# Patient Record
Sex: Female | Born: 1990 | Race: White | Hispanic: No | Marital: Married | Smoking: Never smoker
Health system: Southern US, Community
[De-identification: ages and names within clinical notes are randomized; demographics above are authoritative.]

## PROBLEM LIST (undated history)

## (undated) ENCOUNTER — Inpatient Hospital Stay (HOSPITAL_COMMUNITY): Payer: Self-pay

## (undated) DIAGNOSIS — N6019 Diffuse cystic mastopathy of unspecified breast: Secondary | ICD-10-CM

## (undated) DIAGNOSIS — R5383 Other fatigue: Secondary | ICD-10-CM

## (undated) DIAGNOSIS — G43909 Migraine, unspecified, not intractable, without status migrainosus: Secondary | ICD-10-CM

## (undated) DIAGNOSIS — IMO0002 Reserved for concepts with insufficient information to code with codable children: Secondary | ICD-10-CM

## (undated) DIAGNOSIS — N12 Tubulo-interstitial nephritis, not specified as acute or chronic: Secondary | ICD-10-CM

## (undated) DIAGNOSIS — O9989 Other specified diseases and conditions complicating pregnancy, childbirth and the puerperium: Secondary | ICD-10-CM

## (undated) DIAGNOSIS — O139 Gestational [pregnancy-induced] hypertension without significant proteinuria, unspecified trimester: Secondary | ICD-10-CM

## (undated) DIAGNOSIS — R000 Tachycardia, unspecified: Secondary | ICD-10-CM

## (undated) DIAGNOSIS — R5381 Other malaise: Secondary | ICD-10-CM

## (undated) DIAGNOSIS — B2790 Infectious mononucleosis, unspecified without complication: Secondary | ICD-10-CM

## (undated) DIAGNOSIS — K589 Irritable bowel syndrome without diarrhea: Secondary | ICD-10-CM

## (undated) DIAGNOSIS — Z283 Underimmunization status: Secondary | ICD-10-CM

## (undated) DIAGNOSIS — I499 Cardiac arrhythmia, unspecified: Secondary | ICD-10-CM

## (undated) DIAGNOSIS — Z8619 Personal history of other infectious and parasitic diseases: Secondary | ICD-10-CM

## (undated) HISTORY — DX: Other malaise: R53.81

## (undated) HISTORY — DX: Irritable bowel syndrome, unspecified: K58.9

## (undated) HISTORY — PX: NO PAST SURGERIES: SHX2092

## (undated) HISTORY — DX: Diffuse cystic mastopathy of unspecified breast: N60.19

## (undated) HISTORY — DX: Other fatigue: R53.83

## (undated) HISTORY — DX: Cardiac arrhythmia, unspecified: I49.9

## (undated) HISTORY — DX: Personal history of other infectious and parasitic diseases: Z86.19

## (undated) HISTORY — DX: Gestational (pregnancy-induced) hypertension without significant proteinuria, unspecified trimester: O13.9

## (undated) HISTORY — DX: Migraine, unspecified, not intractable, without status migrainosus: G43.909

## (undated) HISTORY — DX: Tachycardia, unspecified: R00.0

---
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
## 2009-12-16 ENCOUNTER — Ambulatory Visit (HOSPITAL_COMMUNITY): Admission: RE | Admit: 2009-12-16 | Discharge: 2009-12-16 | Payer: Self-pay | Admitting: Orthopaedic Surgery

## 2010-10-02 ENCOUNTER — Ambulatory Visit (HOSPITAL_COMMUNITY): Admission: RE | Admit: 2010-10-02 | Discharge: 2010-10-02 | Payer: Self-pay | Admitting: Cardiovascular Disease

## 2010-10-02 ENCOUNTER — Encounter (INDEPENDENT_AMBULATORY_CARE_PROVIDER_SITE_OTHER): Payer: Self-pay | Admitting: Cardiovascular Disease

## 2010-12-14 ENCOUNTER — Encounter: Payer: Self-pay | Admitting: Family Medicine

## 2010-12-14 ENCOUNTER — Encounter: Payer: Self-pay | Admitting: Orthopaedic Surgery

## 2011-08-25 ENCOUNTER — Other Ambulatory Visit (HOSPITAL_BASED_OUTPATIENT_CLINIC_OR_DEPARTMENT_OTHER): Payer: Self-pay | Admitting: Rheumatology

## 2011-08-25 ENCOUNTER — Ambulatory Visit (HOSPITAL_BASED_OUTPATIENT_CLINIC_OR_DEPARTMENT_OTHER)
Admission: RE | Admit: 2011-08-25 | Discharge: 2011-08-25 | Disposition: A | Discharge status: Home / Self Care | Payer: 59 | Source: Ambulatory Visit | Attending: Family Medicine | Admitting: Family Medicine

## 2011-08-25 DIAGNOSIS — R51 Headache: Secondary | ICD-10-CM | POA: Insufficient documentation

## 2011-08-25 DIAGNOSIS — F29 Unspecified psychosis not due to a substance or known physiological condition: Secondary | ICD-10-CM | POA: Insufficient documentation

## 2011-11-24 NOTE — L&D Delivery Note (Signed)
Delivery Note At 9:13 AM a viable and healthy female was delivered via Vaginal, Spontaneous Delivery (Presentation: ;  ).  APGAR: 9, 9; weight 7 lb 12.2 oz (3521 g).   Placenta status: Intact, Manual removal.  Cord: 3 vessels with the following complications: None.  Cord pH: na Bolckow x one reduced on perineum.  Anesthesia: Epidural  Episiotomy: None Lacerations: 1st degree Suture Repair: 2.0 vicryl rapide Est. Blood Loss (mL): 600  Mom to postpartum.  Baby to nursery-stable.  Shailene Demonbreun J 09/27/2012, 10:26 AM

## 2012-03-03 LAB — OB RESULTS CONSOLE HEPATITIS B SURFACE ANTIGEN: Hepatitis B Surface Ag: NEGATIVE

## 2012-03-03 LAB — OB RESULTS CONSOLE ANTIBODY SCREEN: Antibody Screen: NEGATIVE

## 2012-03-03 LAB — OB RESULTS CONSOLE ABO/RH: RH Type: POSITIVE

## 2012-03-03 LAB — OB RESULTS CONSOLE RUBELLA ANTIBODY, IGM: Rubella: UNDETERMINED

## 2012-09-23 ENCOUNTER — Encounter (HOSPITAL_COMMUNITY): Payer: Self-pay | Admitting: *Deleted

## 2012-09-23 ENCOUNTER — Telehealth (HOSPITAL_COMMUNITY): Payer: Self-pay | Admitting: *Deleted

## 2012-09-23 NOTE — Telephone Encounter (Signed)
Preadmission screen  

## 2012-09-26 ENCOUNTER — Other Ambulatory Visit: Payer: Self-pay | Admitting: Obstetrics and Gynecology

## 2012-09-26 ENCOUNTER — Inpatient Hospital Stay (HOSPITAL_COMMUNITY)
Admission: AD | Admit: 2012-09-26 | Discharge: 2012-09-29 | DRG: 774 | Disposition: A | Discharge status: Home / Self Care | Payer: 59 | Source: Ambulatory Visit | Attending: Obstetrics and Gynecology | Admitting: Obstetrics and Gynecology

## 2012-09-26 ENCOUNTER — Encounter (HOSPITAL_COMMUNITY): Payer: Self-pay | Admitting: *Deleted

## 2012-09-26 DIAGNOSIS — D649 Anemia, unspecified: Secondary | ICD-10-CM | POA: Diagnosis present

## 2012-09-26 DIAGNOSIS — Z283 Underimmunization status: Secondary | ICD-10-CM

## 2012-09-26 DIAGNOSIS — IMO0002 Reserved for concepts with insufficient information to code with codable children: Secondary | ICD-10-CM | POA: Diagnosis present

## 2012-09-26 DIAGNOSIS — O9902 Anemia complicating childbirth: Secondary | ICD-10-CM | POA: Diagnosis present

## 2012-09-26 DIAGNOSIS — O99892 Other specified diseases and conditions complicating childbirth: Secondary | ICD-10-CM

## 2012-09-26 DIAGNOSIS — I2510 Atherosclerotic heart disease of native coronary artery without angina pectoris: Secondary | ICD-10-CM | POA: Diagnosis present

## 2012-09-26 DIAGNOSIS — I498 Other specified cardiac arrhythmias: Secondary | ICD-10-CM | POA: Diagnosis present

## 2012-09-26 HISTORY — DX: Underimmunization status: Z28.3

## 2012-09-26 HISTORY — DX: Other specified diseases and conditions complicating pregnancy, childbirth and the puerperium: O99.89

## 2012-09-26 HISTORY — DX: Reserved for concepts with insufficient information to code with codable children: IMO0002

## 2012-09-26 LAB — CBC
HCT: 33.5 % — ABNORMAL LOW (ref 36.0–46.0)
MCV: 94.1 fL (ref 78.0–100.0)
Platelets: 218 10*3/uL (ref 150–400)
RBC: 3.56 MIL/uL — ABNORMAL LOW (ref 3.87–5.11)
RDW: 13.5 % (ref 11.5–15.5)
WBC: 10.7 10*3/uL — ABNORMAL HIGH (ref 4.0–10.5)

## 2012-09-26 MED ORDER — IBUPROFEN 600 MG PO TABS
600.0000 mg | ORAL_TABLET | Freq: Four times a day (QID) | ORAL | Status: DC | PRN
  Administered 2012-09-27: 600 mg via ORAL
  Filled 2012-09-26: qty 1

## 2012-09-26 MED ORDER — OXYCODONE-ACETAMINOPHEN 5-325 MG PO TABS: 1.0000 | ORAL_TABLET | ORAL | Status: DC | PRN

## 2012-09-26 MED ORDER — PHENYLEPHRINE 40 MCG/ML (10ML) SYRINGE FOR IV PUSH (FOR BLOOD PRESSURE SUPPORT): 80.0000 ug | PREFILLED_SYRINGE | INTRAVENOUS | Status: DC | PRN

## 2012-09-26 MED ORDER — EPHEDRINE 5 MG/ML INJ
10.0000 mg | INTRAVENOUS | Status: DC | PRN
  Filled 2012-09-26: qty 4

## 2012-09-26 MED ORDER — ZOLPIDEM TARTRATE 5 MG PO TABS
5.0000 mg | ORAL_TABLET | Freq: Every evening | ORAL | Status: DC | PRN
  Administered 2012-09-26: 5 mg via ORAL
  Filled 2012-09-26: qty 1

## 2012-09-26 MED ORDER — SODIUM CHLORIDE 0.9 % IJ SOLN: 3.0000 mL | Freq: Two times a day (BID) | INTRAMUSCULAR | Status: DC

## 2012-09-26 MED ORDER — PHENYLEPHRINE 40 MCG/ML (10ML) SYRINGE FOR IV PUSH (FOR BLOOD PRESSURE SUPPORT)
80.0000 ug | PREFILLED_SYRINGE | INTRAVENOUS | Status: DC | PRN
  Filled 2012-09-26: qty 5

## 2012-09-26 MED ORDER — OXYTOCIN 40 UNITS IN LACTATED RINGERS INFUSION - SIMPLE MED
62.5000 mL/h | INTRAVENOUS | Status: DC
  Administered 2012-09-27: 500 mL/h via INTRAVENOUS

## 2012-09-26 MED ORDER — ONDANSETRON HCL 4 MG/2ML IJ SOLN: 4.0000 mg | Freq: Four times a day (QID) | INTRAMUSCULAR | Status: DC | PRN

## 2012-09-26 MED ORDER — LACTATED RINGERS IV SOLN
500.0000 mL | Freq: Once | INTRAVENOUS | Status: AC
  Administered 2012-09-27: 500 mL via INTRAVENOUS

## 2012-09-26 MED ORDER — OXYTOCIN BOLUS FROM INFUSION: 500.0000 mL | INTRAVENOUS | Status: DC

## 2012-09-26 MED ORDER — LIDOCAINE HCL (PF) 1 % IJ SOLN
30.0000 mL | INTRAMUSCULAR | Status: DC | PRN
  Filled 2012-09-26: qty 30

## 2012-09-26 MED ORDER — BUTORPHANOL TARTRATE 1 MG/ML IJ SOLN
1.0000 mg | INTRAMUSCULAR | Status: DC | PRN
  Administered 2012-09-27 (×2): 1 mg via INTRAVENOUS
  Filled 2012-09-26 (×3): qty 1

## 2012-09-26 MED ORDER — DIPHENHYDRAMINE HCL 50 MG/ML IJ SOLN: 12.5000 mg | INTRAMUSCULAR | Status: DC | PRN

## 2012-09-26 MED ORDER — EPHEDRINE 5 MG/ML INJ: 10.0000 mg | INTRAVENOUS | Status: DC | PRN

## 2012-09-26 MED ORDER — FENTANYL 2.5 MCG/ML BUPIVACAINE 1/10 % EPIDURAL INFUSION (WH - ANES)
14.0000 mL/h | INTRAMUSCULAR | Status: DC
  Administered 2012-09-27: 14 mL/h via EPIDURAL
  Filled 2012-09-26 (×2): qty 125

## 2012-09-26 MED ORDER — ACETAMINOPHEN 325 MG PO TABS: 650.0000 mg | ORAL_TABLET | ORAL | Status: DC | PRN

## 2012-09-26 MED ORDER — LACTATED RINGERS IV SOLN
INTRAVENOUS | Status: DC
  Administered 2012-09-26 – 2012-09-27 (×2): via INTRAVENOUS

## 2012-09-26 MED ORDER — TERBUTALINE SULFATE 1 MG/ML IJ SOLN: 0.2500 mg | Freq: Once | INTRAMUSCULAR | Status: AC | PRN

## 2012-09-26 MED ORDER — CITRIC ACID-SODIUM CITRATE 334-500 MG/5ML PO SOLN: 30.0000 mL | ORAL | Status: DC | PRN

## 2012-09-26 MED ORDER — LACTATED RINGERS IV SOLN: 500.0000 mL | INTRAVENOUS | Status: DC | PRN

## 2012-09-26 MED ORDER — SODIUM CHLORIDE 0.9 % IJ SOLN: 3.0000 mL | INTRAMUSCULAR | Status: DC | PRN

## 2012-09-26 MED ORDER — MISOPROSTOL 25 MCG QUARTER TABLET
25.0000 ug | ORAL_TABLET | ORAL | Status: DC | PRN
  Administered 2012-09-26 (×2): 25 ug via VAGINAL
  Filled 2012-09-26 (×2): qty 0.25

## 2012-09-26 MED ORDER — SODIUM CHLORIDE 0.9 % IV SOLN: 250.0000 mL | INTRAVENOUS | Status: DC | PRN

## 2012-09-26 NOTE — Progress Notes (Signed)
Autumn Stephens is a 21 y.o. G1P0 at [redacted]w[redacted]d by LMP admitted for induction of labor due to symptomatic tachycardia on BBlocker.  Subjective: comfortable  Objective: BP 135/88  Pulse 105  Temp 98.8 F (37.1 C) (Oral)  Resp 20  Ht 5\' 10"  (1.778 m)  Wt 100.245 kg (221 lb)  BMI 31.71 kg/m2  LMP 12/12/2011      FHT:  FHR: 155 bpm, variability: moderate,  accelerations:  Present,  decelerations:  Absent UC:   none SVE:    cl/70/0 Cytotec pending  Labs: No results found for this basename: WBC, HGB, HCT, MCV, PLT    Assessment / Plan: 39 + weeks Maternal Arrythmia  Labor: Progressing normally Preeclampsia:  an Fetal Wellbeing:  Category I Pain Control:  Labor support without medications I/D:  n/a Anticipated MOD:  NSVD  Autumn Stephens 09/26/2012, 5:47 PM

## 2012-09-26 NOTE — Progress Notes (Signed)
Autumn Stephens is a 21 y.o. G1P0000 at [redacted]w[redacted]d by LMP admitted for induction of labor due to maternal arrhythmia.  Subjective: comfortable  Objective: BP 126/73  Pulse 85  Temp 98.9 F (37.2 C) (Oral)  Resp 18  Ht 5\' 10"  (1.778 m)  Wt 100.245 kg (221 lb)  BMI 31.71 kg/m2  SpO2 97%  LMP 12/12/2011      FHT:  FHR: 145 bpm, variability: moderate,  accelerations:  Present,  decelerations:  Absent UC:   irregular, every 10 minutes SVE:   Dilation: Closed Effacement (%): 60 Station: -2 Exam by:: Montez Morita, RNC  Labs: Lab Results  Component Value Date   WBC 10.7* 09/26/2012   HGB 11.3* 09/26/2012   HCT 33.5* 09/26/2012   MCV 94.1 09/26/2012   PLT 218 09/26/2012    Assessment / Plan: Maternal Arrhythmia.  Labor: Progressing normally Preeclampsia:  na Fetal Wellbeing:  Category I Pain Control:  Labor support without medications I/D:  n/a Anticipated MOD:  NSVD  Ivyana Locey J 09/26/2012, 11:16 PM

## 2012-09-26 NOTE — H&P (Signed)
Autumn Stephens, Autumn Stephens              ACCOUNT NO.:  [id number]  MEDICAL RECORD NO.:  [id number]  LOCATION:  9175                          FACILITY:  [facility]  PHYSICIAN:  Lenoard Aden, M.D.DATE OF BIRTH:  1991-04-15  DATE OF ADMISSION:  09/26/2012 DATE OF DISCHARGE:                             HISTORY & PHYSICAL   CHIEF COMPLAINT:  Maternal arrhythmia at 39 weeks for induction.  HISTORY OF PRESENT ILLNESS:  A 21 year old white female, G1, P0, at [redacted] weeks gestation with increasing symptomatology due to maternal arrhythmia, currently on atenolol for induction of labor.  ALLERGIES:  She has allergies to BIAXIN, AUGMENTIN and AVELOX.  MEDICAL HISTORY:  Remarkable for symptomatic tachycardia with a normal cardiac workup, migraine headaches.  SOCIAL HISTORY:  Noncontributory.  FAMILY HISTORY:  Remarkable for prostate cancer, rheumatoid arthritis, skin cancer, and diabetes.  This is her first pregnancy.  Pregnancy complicated mostly by maternal tachycardia with increasing symptomatology despite with atenolol therapy.  PHYSICAL EXAMINATION:  GENERAL:  She is a well-developed, well- nourished, white female in no acute distress. VITAL SIGNS:  Blood pressure is 135/88, pulse of 105. HEENT:  Normal. NECK:  Supple.  Full range of motion. LUNGS:  Clear. HEART:  Tachycardia, but otherwise normal rhythm. ABDOMEN:  Soft, gravid, nontender. BACK:  No CVA tenderness. EXTREMITIES:  There are no cords. NEUROLOGIC:  Nonfocal. SKIN:  Intact. PELVIC:  Cervix is closed, 70% effaced, vertex, 0 station, slightly posterior.  NST is reactive.  Cytotec pending.  IMPRESSION:  A 39-week intrauterine pregnancy with maternal arrhythmia with increased symptomatology.  PLAN:  Proceed with induction.  Risks and benefits discussed. Anticipate attempts at vaginal delivery.     Lenoard Aden, M.D.     RJT/MEDQ  D:  09/26/2012  T:  09/26/2012  Job:  [id number]

## 2012-09-27 ENCOUNTER — Inpatient Hospital Stay (HOSPITAL_COMMUNITY): Admission: RE | Admit: 2012-09-27 | Payer: 59 | Source: Ambulatory Visit

## 2012-09-27 ENCOUNTER — Encounter (HOSPITAL_COMMUNITY): Payer: Self-pay | Admitting: Anesthesiology

## 2012-09-27 ENCOUNTER — Inpatient Hospital Stay (HOSPITAL_COMMUNITY): Payer: 59 | Admitting: Anesthesiology

## 2012-09-27 ENCOUNTER — Encounter (HOSPITAL_COMMUNITY): Payer: Self-pay | Admitting: *Deleted

## 2012-09-27 MED ORDER — SENNOSIDES-DOCUSATE SODIUM 8.6-50 MG PO TABS
2.0000 | ORAL_TABLET | Freq: Every day | ORAL | Status: DC
  Administered 2012-09-27 – 2012-09-28 (×2): 2 via ORAL

## 2012-09-27 MED ORDER — WITCH HAZEL-GLYCERIN EX PADS: 1.0000 "application " | MEDICATED_PAD | CUTANEOUS | Status: DC | PRN

## 2012-09-27 MED ORDER — LIDOCAINE HCL (PF) 1 % IJ SOLN
INTRAMUSCULAR | Status: DC | PRN
  Administered 2012-09-27: 4 mL
  Administered 2012-09-27: 5 mL

## 2012-09-27 MED ORDER — BENZOCAINE-MENTHOL 20-0.5 % EX AERO
1.0000 "application " | INHALATION_SPRAY | CUTANEOUS | Status: DC | PRN
  Administered 2012-09-27 (×2): 1 via TOPICAL
  Filled 2012-09-27 (×2): qty 56

## 2012-09-27 MED ORDER — IBUPROFEN 600 MG PO TABS
600.0000 mg | ORAL_TABLET | Freq: Four times a day (QID) | ORAL | Status: DC
  Administered 2012-09-27 – 2012-09-29 (×8): 600 mg via ORAL
  Filled 2012-09-27 (×7): qty 1

## 2012-09-27 MED ORDER — TETANUS-DIPHTH-ACELL PERTUSSIS 5-2.5-18.5 LF-MCG/0.5 IM SUSP: 0.5000 mL | Freq: Once | INTRAMUSCULAR | Status: DC

## 2012-09-27 MED ORDER — OXYTOCIN 40 UNITS IN LACTATED RINGERS INFUSION - SIMPLE MED
1.0000 m[IU]/min | INTRAVENOUS | Status: DC
  Filled 2012-09-27: qty 1000

## 2012-09-27 MED ORDER — PRENATAL MULTIVITAMIN CH
1.0000 | ORAL_TABLET | Freq: Every day | ORAL | Status: DC
  Administered 2012-09-28 – 2012-09-29 (×2): 1 via ORAL
  Filled 2012-09-27 (×2): qty 1

## 2012-09-27 MED ORDER — METHYLERGONOVINE MALEATE 0.2 MG/ML IJ SOLN: 0.2000 mg | INTRAMUSCULAR | Status: DC | PRN

## 2012-09-27 MED ORDER — DIPHENHYDRAMINE HCL 25 MG PO CAPS: 25.0000 mg | ORAL_CAPSULE | Freq: Four times a day (QID) | ORAL | Status: DC | PRN

## 2012-09-27 MED ORDER — ZOLPIDEM TARTRATE 5 MG PO TABS: 5.0000 mg | ORAL_TABLET | Freq: Every evening | ORAL | Status: DC | PRN

## 2012-09-27 MED ORDER — ONDANSETRON HCL 4 MG PO TABS: 4.0000 mg | ORAL_TABLET | ORAL | Status: DC | PRN

## 2012-09-27 MED ORDER — LANOLIN HYDROUS EX OINT: TOPICAL_OINTMENT | CUTANEOUS | Status: DC | PRN

## 2012-09-27 MED ORDER — TERBUTALINE SULFATE 1 MG/ML IJ SOLN: 0.2500 mg | Freq: Once | INTRAMUSCULAR | Status: DC | PRN

## 2012-09-27 MED ORDER — SIMETHICONE 80 MG PO CHEW: 80.0000 mg | CHEWABLE_TABLET | ORAL | Status: DC | PRN

## 2012-09-27 MED ORDER — METHYLERGONOVINE MALEATE 0.2 MG/ML IJ SOLN
INTRAMUSCULAR | Status: AC
  Administered 2012-09-27: 0.2 mg
  Filled 2012-09-27: qty 1

## 2012-09-27 MED ORDER — FENTANYL 2.5 MCG/ML BUPIVACAINE 1/10 % EPIDURAL INFUSION (WH - ANES)
INTRAMUSCULAR | Status: DC | PRN
  Administered 2012-09-27: 15 mL/h via EPIDURAL

## 2012-09-27 MED ORDER — DIBUCAINE 1 % RE OINT: 1.0000 "application " | TOPICAL_OINTMENT | RECTAL | Status: DC | PRN

## 2012-09-27 MED ORDER — OXYCODONE-ACETAMINOPHEN 5-325 MG PO TABS
1.0000 | ORAL_TABLET | ORAL | Status: DC | PRN
  Administered 2012-09-27: 0.5 via ORAL
  Filled 2012-09-27: qty 1

## 2012-09-27 MED ORDER — ONDANSETRON HCL 4 MG/2ML IJ SOLN: 4.0000 mg | INTRAMUSCULAR | Status: DC | PRN

## 2012-09-27 MED ORDER — METHYLERGONOVINE MALEATE 0.2 MG PO TABS: 0.2000 mg | ORAL_TABLET | ORAL | Status: DC | PRN

## 2012-09-27 NOTE — Anesthesia Procedure Notes (Signed)
Epidural Patient location during procedure: OB Start time: 09/27/2012 3:25 AM  Staffing Anesthesiologist: Nikeisha Klutz A. Performed by: anesthesiologist   Preanesthetic Checklist Completed: patient identified, site marked, surgical consent, pre-op evaluation, timeout performed, IV checked, risks and benefits discussed and monitors and equipment checked  Epidural Patient position: sitting Prep: site prepped and draped and DuraPrep Patient monitoring: continuous pulse ox and blood pressure Approach: midline Injection technique: LOR air  Needle:  Needle type: Tuohy  Needle gauge: 17 G Needle length: 9 cm and 9 Needle insertion depth: 7 cm Catheter type: closed end flexible Catheter size: 19 Gauge Catheter at skin depth: 12 cm Test dose: negative and Other  Assessment Events: blood not aspirated, injection not painful, no injection resistance, negative IV test and no paresthesia  Additional Notes Patient identified. Risks and benefits discussed including failed block, incomplete  Pain control, post dural puncture headache, nerve damage, paralysis, blood pressure Changes, nausea, vomiting, reactions to medications-both toxic and allergic and post Partum back pain. All questions were answered. Patient expressed understanding and wished to proceed. Sterile technique was used throughout procedure. Epidural site was Dressed with sterile barrier dressing. No paresthesias, signs of intravascular injection Or signs of intrathecal spread were encountered. Attempt x 2. Difficult due to poor position. 9ml xylocaine 1.5% local infiltration. Patient was more comfortable after the epidural was dosed. Please see RN's note for documentation of vital signs and FHR which are stable.

## 2012-09-27 NOTE — Anesthesia Preprocedure Evaluation (Addendum)
Anesthesia Evaluation  Patient identified by MRN, date of birth, ID band Patient awake    Reviewed: Allergy & Precautions, H&P , Patient's Chart, lab work & pertinent test results, reviewed documented beta blocker date and time   Airway Mallampati: III TM Distance: >3 FB Neck ROM: full    Dental No notable dental hx. (+) Teeth Intact   Pulmonary neg pulmonary ROS,  breath sounds clear to auscultation  Pulmonary exam normal       Cardiovascular Rhythm:regular Rate:Normal  Tachycardia-takes atenolol   Neuro/Psych  Headaches,    GI/Hepatic negative GI ROS, Neg liver ROS,   Endo/Other  negative endocrine ROS  Renal/GU negative Renal ROS  negative genitourinary   Musculoskeletal   Abdominal Normal abdominal exam  (+)   Peds  Hematology negative hematology ROS (+)   Anesthesia Other Findings   Reproductive/Obstetrics (+) Pregnancy                          Anesthesia Physical Anesthesia Plan  ASA: II  Anesthesia Plan: Epidural   Post-op Pain Management:    Induction:   Airway Management Planned:   Additional Equipment:   Intra-op Plan:   Post-operative Plan:   Informed Consent: I have reviewed the patients History and Physical, chart, labs and discussed the procedure including the risks, benefits and alternatives for the proposed anesthesia with the patient or authorized representative who has indicated his/her understanding and acceptance.   Dental Advisory Given  Plan Discussed with: Anesthesiologist  Anesthesia Plan Comments:         Anesthesia Quick Evaluation

## 2012-09-27 NOTE — Anesthesia Postprocedure Evaluation (Signed)
  Anesthesia Post-op Note  Patient: Autumn Stephens  Procedure(s) Performed: * No procedures listed *  Patient Location: Mother/Baby  Anesthesia Type:Epidural  Level of Consciousness: awake, alert  and oriented  Airway and Oxygen Therapy: Patient Spontanous Breathing  Post-op Pain: none  Post-op Assessment: Post-op Vital signs reviewed and Patient's Cardiovascular Status Stable  Post-op Vital Signs: Reviewed and stable  Complications: No apparent anesthesia complications

## 2012-09-27 NOTE — Progress Notes (Signed)
Autumn Stephens is a 21 y.o. G1P0000 at [redacted]w[redacted]d by LMP admitted for induction of labor due to maternal arrhythmia.  Subjective: Occ contractions   Objective: BP 140/96  Pulse 109  Temp 98.4 F (36.9 C) (Oral)  Resp 18  Ht 5\' 10"  (1.778 m)  Wt 100.245 kg (221 lb)  BMI 31.71 kg/m2  SpO2 99%  LMP 12/12/2011   Total I/O In: -  Out: 350 [Urine:350]  FHT:  FHR: 155 bpm, variability: moderate,  accelerations:  Present,  decelerations:  Absent UC:   regular, every 3 minutes SVE:   Dilation: 9 Effacement (%): 90 Station: +1 Exam by:: l.poore, rn   Labs: Lab Results  Component Value Date   WBC 10.7* 09/26/2012   HGB 11.3* 09/26/2012   HCT 33.5* 09/26/2012   MCV 94.1 09/26/2012   PLT 218 09/26/2012    Assessment / Plan: Induction of labor due to maternal tachycardia,  progressing well on pitocin  Labor: Progressing normally Preeclampsia:  na Fetal Wellbeing:  Category I Pain Control:  Epidural I/D:  n/a Anticipated MOD:  NSVD  Carroll Ranney J 09/27/2012, 7:00 AM

## 2012-09-27 NOTE — Progress Notes (Signed)
Pt awakened in a puddle of amniotic fluid.  RN not in the room.

## 2012-09-28 ENCOUNTER — Encounter (HOSPITAL_COMMUNITY): Payer: Self-pay

## 2012-09-28 DIAGNOSIS — IMO0002 Reserved for concepts with insufficient information to code with codable children: Secondary | ICD-10-CM

## 2012-09-28 DIAGNOSIS — O99892 Other specified diseases and conditions complicating childbirth: Secondary | ICD-10-CM

## 2012-09-28 DIAGNOSIS — Z2839 Other underimmunization status: Secondary | ICD-10-CM

## 2012-09-28 DIAGNOSIS — Z283 Underimmunization status: Secondary | ICD-10-CM

## 2012-09-28 HISTORY — DX: Reserved for concepts with insufficient information to code with codable children: IMO0002

## 2012-09-28 HISTORY — DX: Other underimmunization status: Z28.39

## 2012-09-28 HISTORY — DX: Other specified diseases and conditions complicating childbirth: O99.892

## 2012-09-28 LAB — CBC
HCT: 26.2 % — ABNORMAL LOW (ref 36.0–46.0)
Hemoglobin: 8.7 g/dL — ABNORMAL LOW (ref 12.0–15.0)
MCH: 31.4 pg (ref 26.0–34.0)
MCHC: 33.2 g/dL (ref 30.0–36.0)
MCV: 94.6 fL (ref 78.0–100.0)

## 2012-09-28 NOTE — Progress Notes (Signed)
PPD 1 SVD  S:  Reports feeling well.             Tolerating po/ No nausea or vomiting             Bleeding is light             Pain controlled with Motrin             Up ad lib / ambulatory / voiding well   Newborn  Information for the patient's newborn:  Jaleeah, Slight [[id number]]  female  breast feeding    O:  A & O x 3 NAD             VS:  Filed Vitals:   09/27/12 1217 09/27/12 1621 09/28/12 0000 09/28/12 0545  BP: 128/80 130/81 126/78 114/61  Pulse: 103 106 116 101  Temp: 97.4 F (36.3 C) 97.4 F (36.3 C) 97.5 F (36.4 C) 98.5 F (36.9 C)  TempSrc: Oral Oral Oral Oral  Resp: 20 18 20 16   Height:      Weight:      SpO2:        LABS:  Basename 09/28/12 0535 09/26/12 1655  WBC 10.9* 10.7*  HGB 8.7* 11.3*  HCT 26.2* 33.5*  PLT 147* 218    Blood type: A/Positive/-- (04/11 0000)  Rubella: Equivocal (04/11 0000)   I&O: I/O last 3 completed shifts: In: -  Out: 1550 [Urine:950; Blood:600]        Abdomen: soft, non-tender, non-distended              Fundus: firm, non-tender, U -1  Perineum: repair intact  Lochia: small  Extremities: trace edema, no calf pain or tenderness, neg Homans    A/P: PPD # 1 21 y.o., G1P1001    Active Problems:  NSVD (normal spontaneous vaginal delivery - 11/5)  Postpartum care following vaginal delivery  Perineal laceration, first degree  Maternal anemia complicating pregnancy, childbirth, or the puerperium  Rubella nonimmune status, delivered, current hospitalization  MMR prior to DC  TDaP up to date, declines flu vaccine  Doing well - stable status  Routine post partum orders  Anticipate discharge home in AM.   Quierra Silverio, CNM 09/28/2012, 11:52 AM

## 2012-09-29 MED ORDER — POLYSACCHARIDE IRON COMPLEX 150 MG PO CAPS
150.0000 mg | ORAL_CAPSULE | Freq: Every day | ORAL | Status: DC
  Administered 2012-09-29: 150 mg via ORAL
  Filled 2012-09-29: qty 1

## 2012-09-29 MED ORDER — MEASLES, MUMPS & RUBELLA VAC ~~LOC~~ INJ
0.5000 mL | INJECTION | Freq: Once | SUBCUTANEOUS | Status: AC
  Administered 2012-09-29: 0.5 mL via SUBCUTANEOUS
  Filled 2012-09-29: qty 0.5

## 2012-09-29 MED ORDER — MISOPROSTOL 25 MCG QUARTER TABLET: 25.0000 ug | ORAL_TABLET | ORAL | Status: DC | PRN

## 2012-09-29 MED ORDER — TERBUTALINE SULFATE 1 MG/ML IJ SOLN: 0.2500 mg | Freq: Once | INTRAMUSCULAR | Status: DC | PRN

## 2012-09-29 MED ORDER — POLYSACCHARIDE IRON COMPLEX 150 MG PO CAPS: 150.0000 mg | ORAL_CAPSULE | Freq: Every day | ORAL | Status: DC

## 2012-09-29 MED ORDER — OXYCODONE-ACETAMINOPHEN 5-325 MG PO TABS: 1.0000 | ORAL_TABLET | ORAL | Status: DC | PRN

## 2012-09-29 MED ORDER — OXYTOCIN 40 UNITS IN LACTATED RINGERS INFUSION - SIMPLE MED: 1.0000 m[IU]/min | INTRAVENOUS | Status: DC

## 2012-09-29 MED ORDER — ZOLPIDEM TARTRATE 5 MG PO TABS: 5.0000 mg | ORAL_TABLET | Freq: Every evening | ORAL | Status: DC | PRN

## 2012-09-29 MED ORDER — IBUPROFEN 600 MG PO TABS: 600.0000 mg | ORAL_TABLET | Freq: Four times a day (QID) | ORAL | Status: DC

## 2012-09-29 NOTE — Discharge Summary (Signed)
Obstetric Discharge Summary  Reason for Admission: onset of labor Prenatal Procedures: none Intrapartum Procedures: spontaneous vaginal delivery Postpartum Procedures: none Complications-Operative and Postpartum: 2nd degree perineal laceration Hemoglobin  Date Value Range Status  09/28/2012 8.7* 12.0 - 15.0 g/dL Final     DELTA CHECK NOTED     REPEATED TO VERIFY     HCT  Date Value Range Status  09/28/2012 26.2* 36.0 - 46.0 % Final    Physical Exam:  General: alert, cooperative and no distress Lochia: appropriate Uterine Fundus: firm Incision: healing well DVT Evaluation: No evidence of DVT seen on physical exam.  Discharge Diagnoses: Term Pregnancy-delivered  / iron deficiency anemia - ABL  Discharge Information: Date: 09/29/2012 Activity: pelvic rest Diet: routine Medications: PNV, Ibuprofen, Percocet and atenolol and Niferex 150 (iron) Condition: stable Instructions: refer to practice specific booklet Discharge to: home Follow-up Information    Follow up with Lenoard Aden, MD. Schedule an appointment as soon as possible for a visit in 6 weeks.   Contact information:   [address] [place] [address] [phone]          Newborn Data: Live born female  Birth Weight: 7 lb 12.2 oz (3521 g) APGAR: 9, 9  Home with mother.  Marlinda Mike 09/29/2012, 9:53 AM

## 2012-09-29 NOTE — Progress Notes (Signed)
PPD 2 SVD  S:  Reports feeling well             Tolerating po/ No nausea or vomiting             Bleeding is light             Pain controlled with motrin and percocet             Up ad lib / ambulatory  Newborn breast feeding     O:               VS: BP 110/73  Pulse 90  Temp 97.6 F (36.4 C) (Oral)  Resp 18  Ht 5\' 10"  (1.778 m)  Wt 100.245 kg (221 lb)  BMI 31.71 kg/m2  SpO2 99%  LMP 12/12/2011  Breastfeeding? Unknown   LABS:  Basename 09/28/12 0535 09/26/12 1655  WBC 10.9* 10.7*  HGB 8.7* 11.3*  PLT 147* 218                      Physical Exam:             Alert and oriented X3  Lungs: Clear and unlabored  Heart: regular rate and rhythm / no mumurs  Abdomen: soft, non-tender, non-distended              Fundus: firm, non-tender, U-2  Perineum: no edema  Lochia: light  Extremities: no edema, no calf pain or tenderness    A: PPD # 2   Doing well - stable status  P:  Routine post partum orders  Discharge home  Marlinda Mike CNM, MSN 09/29/2012, 9:50 AM

## 2012-10-03 ENCOUNTER — Ambulatory Visit (HOSPITAL_COMMUNITY): Payer: 59

## 2012-10-06 ENCOUNTER — Ambulatory Visit (HOSPITAL_COMMUNITY): Payer: 59

## 2012-10-06 ENCOUNTER — Ambulatory Visit (HOSPITAL_COMMUNITY)
Admission: RE | Admit: 2012-10-06 | Discharge: 2012-10-06 | Disposition: A | Discharge status: Home / Self Care | Payer: 59 | Source: Ambulatory Visit | Attending: Obstetrics and Gynecology | Admitting: Obstetrics and Gynecology

## 2012-10-06 NOTE — Progress Notes (Signed)
Adult Lactation Consultation Outpatient Visit Note  Patient Name: Autumn Stephens Date of Birth: 01/23/1991 Gestational Age at Delivery: [redacted]w[redacted]d Type of Delivery: Vaginal, BW: 7 lbs 12 oz  Breastfeeding History: Frequency of Breastfeeding: Mother has been pumping and giving EBM in a bottle. Stopped latching 2 days ago due to nipple soreness. Length of Feeding: Baby taking 2-2.5 oz per feeding EBM via bottle Voids: 6-8/day Stools: 5-6/day turning yellow  Supplementing / Method: Pumping:  Type of Pump: PIS   Frequency: q 2-3 hrs  Volume: 4 oz per session   Comments: Mother had difficulties with latching in the hospital and [facility] consults were made. Following d/c from hospital,  Nipple soreness worsened and became cracked and bleeding. Mother was pumping to build milk supply and breastfeeding. Baby was started on phototherapy on 10/01/12 and had lost more weight. Mother stopped breastfeeding and fed baby  her EBM per bottle. Baby gainned  weight and jaundice resolved. Mother's nipples have healed and she desires to resume breastfeeding. She has attempted latching over the last 48 hours but baby was fussy and pulling back. She is here today for assistance and evaluation of feedings.   Consultation Evaluation:  Initial Feeding Assessment: Pre-feed Weight: 3402 g (7lbs 8oz) Post-feed Weight: 3444 g Amount Transferred: 42ml Comments: Assisted mother with positioning, pillow support and latch. Mother has an excellent milk supply and praised her for her commitment to pumping regular to maintain her supply. Infant fed on right breast. Nipple has healed but indentation across the center of the nipple was  noted where crack and scab had formed. Mother was coached through the first latch and baby latched well with audible swallows heard immediately. Baby fed consistently for 8 minutes and came off the breast satiated. Post weight indicated a 42 ml transfer. Mother denied pain with latch. Lots of teaching done  with hand placement and obtaining depth for milk transfer and avoidance of sore nipples.  Additional Feeding Assessment: Pre-feed Weight: Post-feed Weight: Amount Transferred: Comments: Attempted to latch to the left breast but baby only took a few sucks. She was calm and did not show feeding cues.  Additional Feeding Assessment: Total Breast milk Transferred this Visit: 42 ml Total Supplement Given: none  Additional Interventions: Mother pleased with the outcome and is motivated to breastfeed. Discussed feeding baby frequently with feeding cues. Mother has become dependent on pumping and appears apprehensive to stop. Instructed to pump after feeding if breast remain full and pump to remove milk to her comfort. Mother is making a large volume of milk and over time this will be accommodated by baby.    Follow-Up  Mother will call [facility] office if needs additional assistance. Encouraged to attend the Breastfeeding Support Group on Tuesdays, here at the hospital. [facility] will be available for advice and basic assist  as needed.     Christella Hartigan M 10/06/2012, 10:05 AM

## 2013-11-02 LAB — OB RESULTS CONSOLE HIV ANTIBODY (ROUTINE TESTING): HIV: NONREACTIVE

## 2013-11-02 LAB — OB RESULTS CONSOLE ANTIBODY SCREEN: Antibody Screen: NEGATIVE

## 2013-11-02 LAB — OB RESULTS CONSOLE ABO/RH: RH Type: POSITIVE

## 2013-11-02 LAB — OB RESULTS CONSOLE RUBELLA ANTIBODY, IGM: RUBELLA: IMMUNE

## 2013-11-02 LAB — OB RESULTS CONSOLE HEPATITIS B SURFACE ANTIGEN: Hepatitis B Surface Ag: NEGATIVE

## 2013-11-02 LAB — OB RESULTS CONSOLE RPR: RPR: NONREACTIVE

## 2013-11-23 NOTE — L&D Delivery Note (Signed)
Delivery Note At 2:20 PM a viable and healthy female was delivered via Vaginal, Spontaneous Delivery (Presentation: Left Occiput Anterior).  APGAR: 9, 9; weight pending.  Placenta status: Intact, Spontaneous.  Cord: 3 vessels with the following complications: .  Cord pH: na  Anesthesia: Epidural  Episiotomy: None Lacerations: 2nd degree Suture Repair: 2.0 vicryl rapide Est. Blood Loss (mL): 250  Mom to postpartum.  Baby to Couplet care / Skin to Skin.  Mozetta Murfin J 05/28/2014, 2:43 PM

## 2014-04-30 LAB — OB RESULTS CONSOLE GC/CHLAMYDIA
Chlamydia: NEGATIVE
GC PROBE AMP, GENITAL: NEGATIVE

## 2014-04-30 LAB — OB RESULTS CONSOLE GBS: STREP GROUP B AG: POSITIVE

## 2014-05-21 ENCOUNTER — Other Ambulatory Visit: Payer: Self-pay | Admitting: Obstetrics and Gynecology

## 2014-05-23 ENCOUNTER — Telehealth (HOSPITAL_COMMUNITY): Payer: Self-pay | Admitting: *Deleted

## 2014-05-23 ENCOUNTER — Other Ambulatory Visit: Payer: Self-pay | Admitting: Obstetrics and Gynecology

## 2014-05-23 ENCOUNTER — Encounter (HOSPITAL_COMMUNITY): Payer: Self-pay | Admitting: *Deleted

## 2014-05-23 NOTE — Telephone Encounter (Signed)
Preadmission screen  

## 2014-05-24 ENCOUNTER — Encounter (HOSPITAL_COMMUNITY): Payer: Self-pay | Admitting: *Deleted

## 2014-05-24 ENCOUNTER — Telehealth (HOSPITAL_COMMUNITY): Payer: Self-pay | Admitting: *Deleted

## 2014-05-24 NOTE — Telephone Encounter (Signed)
Preadmission screen  

## 2014-05-27 ENCOUNTER — Inpatient Hospital Stay (HOSPITAL_COMMUNITY)
Admission: RE | Admit: 2014-05-27 | Discharge: 2014-05-29 | DRG: 774 | Disposition: A | Discharge status: Home / Self Care | Payer: 59 | Source: Ambulatory Visit | Attending: Obstetrics and Gynecology | Admitting: Obstetrics and Gynecology

## 2014-05-27 ENCOUNTER — Encounter (HOSPITAL_COMMUNITY): Payer: Self-pay

## 2014-05-27 DIAGNOSIS — O9942 Diseases of the circulatory system complicating childbirth: Secondary | ICD-10-CM

## 2014-05-27 DIAGNOSIS — R000 Tachycardia, unspecified: Secondary | ICD-10-CM | POA: Diagnosis present

## 2014-05-27 DIAGNOSIS — O3660X0 Maternal care for excessive fetal growth, unspecified trimester, not applicable or unspecified: Secondary | ICD-10-CM | POA: Diagnosis present

## 2014-05-27 DIAGNOSIS — O9989 Other specified diseases and conditions complicating pregnancy, childbirth and the puerperium: Secondary | ICD-10-CM

## 2014-05-27 DIAGNOSIS — O9902 Anemia complicating childbirth: Secondary | ICD-10-CM | POA: Diagnosis present

## 2014-05-27 DIAGNOSIS — Z22330 Carrier of Group B streptococcus: Secondary | ICD-10-CM

## 2014-05-27 DIAGNOSIS — I2510 Atherosclerotic heart disease of native coronary artery without angina pectoris: Secondary | ICD-10-CM | POA: Diagnosis present

## 2014-05-27 DIAGNOSIS — Z79899 Other long term (current) drug therapy: Secondary | ICD-10-CM

## 2014-05-27 DIAGNOSIS — D649 Anemia, unspecified: Secondary | ICD-10-CM | POA: Diagnosis present

## 2014-05-27 DIAGNOSIS — K589 Irritable bowel syndrome without diarrhea: Secondary | ICD-10-CM | POA: Diagnosis present

## 2014-05-27 DIAGNOSIS — O409XX0 Polyhydramnios, unspecified trimester, not applicable or unspecified: Principal | ICD-10-CM | POA: Diagnosis present

## 2014-05-27 DIAGNOSIS — O99892 Other specified diseases and conditions complicating childbirth: Secondary | ICD-10-CM | POA: Diagnosis present

## 2014-05-27 LAB — CBC
HCT: 33.5 % — ABNORMAL LOW (ref 36.0–46.0)
HEMOGLOBIN: 10.9 g/dL — AB (ref 12.0–15.0)
MCH: 30.6 pg (ref 26.0–34.0)
MCHC: 32.5 g/dL (ref 30.0–36.0)
MCV: 94.1 fL (ref 78.0–100.0)
Platelets: 203 10*3/uL (ref 150–400)
RBC: 3.56 MIL/uL — ABNORMAL LOW (ref 3.87–5.11)
RDW: 15.3 % (ref 11.5–15.5)
WBC: 9.4 10*3/uL (ref 4.0–10.5)

## 2014-05-27 MED ORDER — FLEET ENEMA 7-19 GM/118ML RE ENEM: 1.0000 | ENEMA | RECTAL | Status: DC | PRN

## 2014-05-27 MED ORDER — OXYCODONE-ACETAMINOPHEN 5-325 MG PO TABS: 1.0000 | ORAL_TABLET | ORAL | Status: DC | PRN

## 2014-05-27 MED ORDER — TERBUTALINE SULFATE 1 MG/ML IJ SOLN: 0.2500 mg | Freq: Once | INTRAMUSCULAR | Status: AC | PRN

## 2014-05-27 MED ORDER — IBUPROFEN 600 MG PO TABS: 600.0000 mg | ORAL_TABLET | Freq: Four times a day (QID) | ORAL | Status: DC | PRN

## 2014-05-27 MED ORDER — ONDANSETRON HCL 4 MG/2ML IJ SOLN: 4.0000 mg | Freq: Four times a day (QID) | INTRAMUSCULAR | Status: DC | PRN

## 2014-05-27 MED ORDER — LACTATED RINGERS IV SOLN
500.0000 mL | INTRAVENOUS | Status: DC | PRN
  Administered 2014-05-28: 300 mL via INTRAVENOUS
  Administered 2014-05-28: 500 mL via INTRAVENOUS

## 2014-05-27 MED ORDER — CEFAZOLIN SODIUM 1-5 GM-% IV SOLN
1.0000 g | Freq: Three times a day (TID) | INTRAVENOUS | Status: DC
  Filled 2014-05-27 (×2): qty 50

## 2014-05-27 MED ORDER — LIDOCAINE HCL (PF) 1 % IJ SOLN
30.0000 mL | INTRAMUSCULAR | Status: DC | PRN
  Filled 2014-05-27: qty 30

## 2014-05-27 MED ORDER — CITRIC ACID-SODIUM CITRATE 334-500 MG/5ML PO SOLN: 30.0000 mL | ORAL | Status: DC | PRN

## 2014-05-27 MED ORDER — CEFAZOLIN SODIUM-DEXTROSE 2-3 GM-% IV SOLR: 2.0000 g | Freq: Four times a day (QID) | INTRAVENOUS | Status: DC

## 2014-05-27 MED ORDER — LACTATED RINGERS IV SOLN
INTRAVENOUS | Status: DC
Start: 2014-05-27 — End: 2014-05-28
  Administered 2014-05-27 – 2014-05-28 (×2): via INTRAVENOUS

## 2014-05-27 MED ORDER — ACETAMINOPHEN 325 MG PO TABS: 650.0000 mg | ORAL_TABLET | ORAL | Status: DC | PRN

## 2014-05-27 MED ORDER — OXYTOCIN 40 UNITS IN LACTATED RINGERS INFUSION - SIMPLE MED: 62.5000 mL/h | INTRAVENOUS | Status: DC

## 2014-05-27 MED ORDER — MISOPROSTOL 25 MCG QUARTER TABLET
25.0000 ug | ORAL_TABLET | ORAL | Status: DC | PRN
  Administered 2014-05-27: 25 ug via VAGINAL
  Filled 2014-05-27: qty 1
  Filled 2014-05-27: qty 0.25

## 2014-05-27 MED ORDER — OXYTOCIN BOLUS FROM INFUSION: 500.0000 mL | INTRAVENOUS | Status: DC

## 2014-05-27 MED ORDER — CEFAZOLIN SODIUM-DEXTROSE 2-3 GM-% IV SOLR
2.0000 g | Freq: Once | INTRAVENOUS | Status: AC
  Administered 2014-05-27: 2 g via INTRAVENOUS
  Filled 2014-05-27: qty 50

## 2014-05-27 NOTE — H&P (Signed)
Autumn Stephens is a 23 y.o. female presenting for IOL for BBlocker use at 39 weeks.  Maternal Medical History:  Contractions: Onset was less than 1 hour ago.   Frequency: rare.   Perceived severity is mild.    Fetal activity: Perceived fetal activity is normal.   Last perceived fetal movement was within the past hour.    Prenatal complications: Polyhydramnios.   Prenatal Complications - Diabetes: none.    OB History   Grav Para Term Preterm Abortions TAB SAB Ect Mult Living   2 1 1  0 0 0 0 0 0 1     Past Medical History  Diagnosis Date  . H/O varicella   . Migraine, unspecified, without mention of intractable migraine without mention of status migrainosus   . Irritable bowel syndrome   . Diffuse cystic mastopathy   . Other malaise and fatigue   . Tachycardia   . NSVD (normal spontaneous vaginal delivery - 11/5) 09/28/2012  . Postpartum care following vaginal delivery 09/28/2012  . Perineal laceration, first degree 09/28/2012  . Maternal anemia complicating pregnancy, childbirth, or the puerperium 09/28/2012  . Rubella nonimmune status, delivered, current hospitalization 09/28/2012   Past Surgical History  Procedure Laterality Date  . No past surgeries     Family History: family history includes Cancer in her maternal grandfather and mother; Diabetes in her maternal grandfather; Rheum arthritis in her father. Social History:  reports that she has never smoked. She has never used smokeless tobacco. She reports that she does not drink alcohol or use illicit drugs.   Prenatal Transfer Tool  Maternal Diabetes: No Genetic Screening: Normal Maternal Ultrasounds/Referrals: Normal Fetal Ultrasounds or other Referrals:  None Maternal Substance Abuse:  No Significant Maternal Medications:  Meds include: Other: BBlocker Significant Maternal Lab Results:  None- GBS positive Other Comments:  None  Review of Systems  All other systems reviewed and are negative.   Dilation:  Closed Effacement (%): Thick Station: -3 Exam by:: L.Mears,RN Blood pressure 134/71, pulse 117, temperature 98.2 F (36.8 C), temperature source Oral, resp. rate 18, height 5\' 10"  (1.778 m), weight 108.863 kg (240 lb), last menstrual period 08/28/2013, unknown if currently breastfeeding. Maternal Exam:  Uterine Assessment: Contraction strength is mild.  Contraction frequency is rare.   Abdomen: Patient reports no abdominal tenderness. Fetal presentation: vertex  Introitus: Normal vulva. Normal vagina.  Ferning test: not done.  Nitrazine test: not done. Amniotic fluid character: not assessed.  Pelvis: adequate for delivery.   Cervix: Cervix evaluated by digital exam.     Physical Exam  Constitutional: She is oriented to person, place, and time. She appears well-developed and well-nourished.  Eyes: Pupils are equal, round, and reactive to light.  Neck: Normal range of motion.  Cardiovascular: Normal rate and regular rhythm.   Respiratory: Effort normal and breath sounds normal.  GI: Soft. Bowel sounds are normal.  Genitourinary: Vagina normal and uterus normal.  Musculoskeletal: Normal range of motion.  Neurological: She is alert and oriented to person, place, and time.  Skin: Skin is warm and dry.  Psychiatric: She has a normal mood and affect. Her behavior is normal. Judgment and thought content normal.    Prenatal labs: ABO, Rh: A/Positive/-- (12/11 0000) Antibody: Negative (12/11 0000) Rubella: Immune (12/11 0000) RPR: Nonreactive (12/11 0000)  HBsAg: Negative (12/11 0000)  HIV: Non-reactive (12/11 0000)  GBS: Positive (06/08 0000)   Assessment/Plan: IOL at term for BBLocker use Maternal Sinus Tachycardia Presumed LGA with polyhydramnios Admit Cytotec placed Pitocin in  AM   Carnelia Oscar J 05/27/2014, 10:47 PM

## 2014-05-28 ENCOUNTER — Encounter (HOSPITAL_COMMUNITY): Payer: 59 | Admitting: Anesthesiology

## 2014-05-28 ENCOUNTER — Encounter (HOSPITAL_COMMUNITY): Payer: Self-pay

## 2014-05-28 ENCOUNTER — Inpatient Hospital Stay (HOSPITAL_COMMUNITY): Payer: 59 | Admitting: Anesthesiology

## 2014-05-28 DIAGNOSIS — R000 Tachycardia, unspecified: Secondary | ICD-10-CM | POA: Diagnosis present

## 2014-05-28 LAB — RPR

## 2014-05-28 MED ORDER — EPHEDRINE 5 MG/ML INJ
10.0000 mg | INTRAVENOUS | Status: DC | PRN
  Filled 2014-05-28: qty 2

## 2014-05-28 MED ORDER — TETANUS-DIPHTH-ACELL PERTUSSIS 5-2.5-18.5 LF-MCG/0.5 IM SUSP: 0.5000 mL | Freq: Once | INTRAMUSCULAR | Status: DC

## 2014-05-28 MED ORDER — METHYLERGONOVINE MALEATE 0.2 MG PO TABS: 0.2000 mg | ORAL_TABLET | ORAL | Status: DC | PRN

## 2014-05-28 MED ORDER — EPHEDRINE 5 MG/ML INJ
10.0000 mg | INTRAVENOUS | Status: DC | PRN
  Filled 2014-05-28: qty 4
  Filled 2014-05-28: qty 2

## 2014-05-28 MED ORDER — PHENYLEPHRINE 40 MCG/ML (10ML) SYRINGE FOR IV PUSH (FOR BLOOD PRESSURE SUPPORT)
80.0000 ug | PREFILLED_SYRINGE | INTRAVENOUS | Status: DC | PRN
  Filled 2014-05-28: qty 2
  Filled 2014-05-28: qty 10

## 2014-05-28 MED ORDER — FENTANYL 2.5 MCG/ML BUPIVACAINE 1/10 % EPIDURAL INFUSION (WH - ANES)
14.0000 mL/h | INTRAMUSCULAR | Status: DC | PRN
  Administered 2014-05-28 (×2): 14 mL/h via EPIDURAL
  Filled 2014-05-28 (×2): qty 125

## 2014-05-28 MED ORDER — SENNOSIDES-DOCUSATE SODIUM 8.6-50 MG PO TABS
2.0000 | ORAL_TABLET | ORAL | Status: DC
  Administered 2014-05-28: 2 via ORAL
  Filled 2014-05-28: qty 2

## 2014-05-28 MED ORDER — TERBUTALINE SULFATE 1 MG/ML IJ SOLN: 0.2500 mg | Freq: Once | INTRAMUSCULAR | Status: DC | PRN

## 2014-05-28 MED ORDER — BENZOCAINE-MENTHOL 20-0.5 % EX AERO
1.0000 "application " | INHALATION_SPRAY | CUTANEOUS | Status: DC | PRN
  Filled 2014-05-28: qty 56

## 2014-05-28 MED ORDER — WITCH HAZEL-GLYCERIN EX PADS: 1.0000 "application " | MEDICATED_PAD | CUTANEOUS | Status: DC | PRN

## 2014-05-28 MED ORDER — FENTANYL 2.5 MCG/ML BUPIVACAINE 1/10 % EPIDURAL INFUSION (WH - ANES)
INTRAMUSCULAR | Status: DC | PRN
  Administered 2014-05-28: 14 mL/h via EPIDURAL

## 2014-05-28 MED ORDER — IBUPROFEN 600 MG PO TABS
600.0000 mg | ORAL_TABLET | Freq: Four times a day (QID) | ORAL | Status: DC
  Administered 2014-05-28 – 2014-05-29 (×2): 600 mg via ORAL
  Filled 2014-05-28 (×3): qty 1

## 2014-05-28 MED ORDER — OXYTOCIN 40 UNITS IN LACTATED RINGERS INFUSION - SIMPLE MED
1.0000 m[IU]/min | INTRAVENOUS | Status: DC
Start: 2014-05-28 — End: 2014-05-28
  Administered 2014-05-28: 2 m[IU]/min via INTRAVENOUS
  Filled 2014-05-28: qty 1000

## 2014-05-28 MED ORDER — SIMETHICONE 80 MG PO CHEW: 80.0000 mg | CHEWABLE_TABLET | ORAL | Status: DC | PRN

## 2014-05-28 MED ORDER — DIPHENHYDRAMINE HCL 25 MG PO CAPS: 25.0000 mg | ORAL_CAPSULE | Freq: Four times a day (QID) | ORAL | Status: DC | PRN

## 2014-05-28 MED ORDER — ATENOLOL 25 MG PO TABS
25.0000 mg | ORAL_TABLET | Freq: Every day | ORAL | Status: DC
  Administered 2014-05-28: 25 mg via ORAL
  Filled 2014-05-28 (×2): qty 1

## 2014-05-28 MED ORDER — OXYCODONE-ACETAMINOPHEN 5-325 MG PO TABS: 1.0000 | ORAL_TABLET | ORAL | Status: DC | PRN

## 2014-05-28 MED ORDER — DIPHENHYDRAMINE HCL 25 MG PO CAPS
50.0000 mg | ORAL_CAPSULE | Freq: Four times a day (QID) | ORAL | Status: DC | PRN
  Administered 2014-05-28: 50 mg via ORAL
  Filled 2014-05-28: qty 2

## 2014-05-28 MED ORDER — ONDANSETRON HCL 4 MG/2ML IJ SOLN: 4.0000 mg | INTRAMUSCULAR | Status: DC | PRN

## 2014-05-28 MED ORDER — CLINDAMYCIN PHOSPHATE 900 MG/50ML IV SOLN
900.0000 mg | Freq: Three times a day (TID) | INTRAVENOUS | Status: DC
  Administered 2014-05-28: 900 mg via INTRAVENOUS
  Filled 2014-05-28 (×3): qty 50

## 2014-05-28 MED ORDER — PRENATAL MULTIVITAMIN CH: 1.0000 | ORAL_TABLET | Freq: Every day | ORAL | Status: DC

## 2014-05-28 MED ORDER — DIPHENHYDRAMINE HCL 50 MG/ML IJ SOLN: 12.5000 mg | INTRAMUSCULAR | Status: DC | PRN

## 2014-05-28 MED ORDER — METHYLERGONOVINE MALEATE 0.2 MG/ML IJ SOLN: 0.2000 mg | INTRAMUSCULAR | Status: DC | PRN

## 2014-05-28 MED ORDER — PHENYLEPHRINE 40 MCG/ML (10ML) SYRINGE FOR IV PUSH (FOR BLOOD PRESSURE SUPPORT)
80.0000 ug | PREFILLED_SYRINGE | INTRAVENOUS | Status: DC | PRN
  Filled 2014-05-28: qty 2

## 2014-05-28 MED ORDER — LACTATED RINGERS IV SOLN: 500.0000 mL | Freq: Once | INTRAVENOUS | Status: AC

## 2014-05-28 MED ORDER — DIBUCAINE 1 % RE OINT
1.0000 "application " | TOPICAL_OINTMENT | RECTAL | Status: DC | PRN
  Filled 2014-05-28: qty 28

## 2014-05-28 MED ORDER — LIDOCAINE HCL (PF) 1 % IJ SOLN
INTRAMUSCULAR | Status: DC | PRN
  Administered 2014-05-28 (×2): 4 mL

## 2014-05-28 MED ORDER — SODIUM BICARBONATE 8.4 % IV SOLN
INTRAVENOUS | Status: DC | PRN
  Administered 2014-05-28 (×2): 5 mL via EPIDURAL

## 2014-05-28 MED ORDER — LANOLIN HYDROUS EX OINT: TOPICAL_OINTMENT | CUTANEOUS | Status: DC | PRN

## 2014-05-28 MED ORDER — ONDANSETRON HCL 4 MG PO TABS: 4.0000 mg | ORAL_TABLET | ORAL | Status: DC | PRN

## 2014-05-28 MED ORDER — ZOLPIDEM TARTRATE 5 MG PO TABS: 5.0000 mg | ORAL_TABLET | Freq: Every evening | ORAL | Status: DC | PRN

## 2014-05-28 NOTE — Anesthesia Preprocedure Evaluation (Signed)
Anesthesia Evaluation  Patient identified by MRN, date of birth, ID band Patient awake    Reviewed: Allergy & Precautions, H&P , Patient's Chart, lab work & pertinent test results, reviewed documented beta blocker date and time   Airway Mallampati: III TM Distance: >3 FB Neck ROM: Full    Dental no notable dental hx. (+) Teeth Intact   Pulmonary neg pulmonary ROS,  breath sounds clear to auscultation  Pulmonary exam normal       Cardiovascular negative cardio ROS  Rhythm:Regular Rate:Normal  Tachycardia-controlled with beta blocker   Neuro/Psych  Headaches, negative psych ROS   GI/Hepatic Neg liver ROS, GERD-  ,  Endo/Other  Obesity  Renal/GU negative Renal ROS  negative genitourinary   Musculoskeletal negative musculoskeletal ROS (+)   Abdominal (+) + obese,   Peds  Hematology  (+) anemia ,   Anesthesia Other Findings   Reproductive/Obstetrics (+) Pregnancy                           Anesthesia Physical Anesthesia Plan  ASA: II  Anesthesia Plan: Epidural   Post-op Pain Management:    Induction:   Airway Management Planned: Natural Airway  Additional Equipment:   Intra-op Plan:   Post-operative Plan:   Informed Consent: I have reviewed the patients History and Physical, chart, labs and discussed the procedure including the risks, benefits and alternatives for the proposed anesthesia with the patient or authorized representative who has indicated his/her understanding and acceptance.     Plan Discussed with: Anesthesiologist  Anesthesia Plan Comments:         Anesthesia Quick Evaluation

## 2014-05-28 NOTE — Progress Notes (Signed)
Left lateral pushing

## 2014-05-28 NOTE — Progress Notes (Signed)
Autumn Stephens is a 23 y.o. G2P1001 at 1841w0d by LMP admitted for induction of labor due to BBlocker use at 193f9 weeks.  Subjective: Uncomfortable  Objective: BP 120/74  Pulse 80  Temp(Src) 98.1 F (36.7 C) (Oral)  Resp 18  Ht 5\' 10"  (1.778 m)  Wt 108.863 kg (240 lb)  BMI 34.44 kg/m2  SpO2 100%  LMP 08/28/2013      FHT:  FHR: 145 bpm, variability: moderate,  accelerations:  Present,  decelerations:  Absent UC:   regular, every 3 minutes SVE:   Dilation: 2.5 Effacement (%): 70 Station: -1 Exam by:: Dr Billy Coastaavon  Labs: Lab Results  Component Value Date   WBC 9.4 05/27/2014   HGB 10.9* 05/27/2014   HCT 33.5* 05/27/2014   MCV 94.1 05/27/2014   PLT 203 05/27/2014    Assessment / Plan: Induction of labor due to [facility] conditions,  progressing well on pitocin  Labor: Progressing normally Preeclampsia:  no signs or symptoms of toxicity Fetal Wellbeing:  Category I Pain Control:  Labor support without medications I/D:  n/a Anticipated MOD:  NSVD  Autumn Stephens J 05/28/2014, 10:56 AM

## 2014-05-28 NOTE — Progress Notes (Signed)
This note also relates to the following rows which could not be included: BP - Cannot attach notes to unvalidated device data Pulse Rate - Cannot attach notes to unvalidated device data   Ready to push.

## 2014-05-28 NOTE — Lactation Note (Signed)
This note was copied from the chart of Autumn Stephens Manzer. Lactation Consultation Note Initial visit at 6 hours of age.  Mom reports a good feeding and baby is STS on chest now.  Baby is observed to be making intermittent grunting noises,  Assisted with repositioning baby,  Baby is pink with normal breathing effort.  Rep[facility] to [facility].  [facility] resources given and discussed.  Encouraged to feed with early cues on demand.  Early newborn behavior discussed.  Hand expression demonstrated with colostrum visible.  Discussed future pumping concerns.  Encouraged mom to allow breastfeeding until she is more comfortable and milk supply is established.   Mom to call for assist as needed.    Patient Name: Autumn Stephens Hanneman [id number]'s Date: 05/28/2014 Reason for consult: Initial assessment   Maternal Data Has patient been taught Hand Expression?: Yes Does the patient have breastfeeding experience prior to this delivery?: Yes  Feeding Feeding Type: Breast Fed Length of feed: 20 min  LATCH Score/Interventions Latch: Grasps breast easily, tongue down, lips flanged, rhythmical sucking.  Audible Swallowing: A few with stimulation Intervention(s): Skin to skin;Hand expression  Type of Nipple: Everted at rest and after stimulation  Comfort (Breast/Nipple): Soft / non-tender     Hold (Positioning): No assistance needed to correctly position infant at breast. Intervention(s): Breastfeeding basics reviewed  LATCH Score: 9  Lactation Tools Discussed/Used     Consult Status Consult Status: Follow-up Date: 05/29/14 Follow-up type: In-patient    Elizeo Rodriques, Arvella MerlesJana Lynn 05/28/2014, 10:11 PM

## 2014-05-28 NOTE — Anesthesia Procedure Notes (Signed)
Epidural Patient location during procedure: OB Start time: 05/28/2014 9:31 AM  Staffing Anesthesiologist: Orella Cushman A. Performed by: anesthesiologist   Preanesthetic Checklist Completed: patient identified, site marked, surgical consent, pre-op evaluation, timeout performed, IV checked, risks and benefits discussed and monitors and equipment checked  Epidural Patient position: sitting Prep: site prepped and draped and DuraPrep Patient monitoring: continuous pulse ox and blood pressure Approach: midline Location: L3-L4 Injection technique: LOR air  Needle:  Needle type: Tuohy  Needle gauge: 17 G Needle length: 9 cm and 9 Needle insertion depth: 7 cm Catheter type: closed end flexible Catheter size: 19 Gauge Catheter at skin depth: 12 cm Test dose: negative and Other  Assessment Events: blood not aspirated, injection not painful, no injection resistance, negative IV test and no paresthesia  Additional Notes Patient identified. Risks and benefits discussed including failed block, incomplete  Pain control, post dural puncture headache, nerve damage, paralysis, blood pressure Changes, nausea, vomiting, reactions to medications-both toxic and allergic and post Partum back pain. All questions were answered. Patient expressed understanding and wished to proceed. Sterile technique was used throughout procedure. Epidural site was Dressed with sterile barrier dressing. No paresthesias, signs of intravascular injection Or signs of intrathecal spread were encountered.  Patient was more comfortable after the epidural was dosed. Please see RN's note for documentation of vital signs and FHR which are stable.

## 2014-05-29 LAB — CBC
HEMATOCRIT: 30.7 % — AB (ref 36.0–46.0)
HEMOGLOBIN: 10 g/dL — AB (ref 12.0–15.0)
MCH: 30.7 pg (ref 26.0–34.0)
MCHC: 32.6 g/dL (ref 30.0–36.0)
MCV: 94.2 fL (ref 78.0–100.0)
Platelets: 173 10*3/uL (ref 150–400)
RBC: 3.26 MIL/uL — AB (ref 3.87–5.11)
RDW: 15.5 % (ref 11.5–15.5)
WBC: 11.2 10*3/uL — AB (ref 4.0–10.5)

## 2014-05-29 MED ORDER — IBUPROFEN 600 MG PO TABS: 600.0000 mg | ORAL_TABLET | Freq: Four times a day (QID) | ORAL | Status: DC

## 2014-05-29 NOTE — Progress Notes (Addendum)
PPD #1- SVD  Subjective:   Reports feeling well, desires early discharge Tolerating po/ No nausea or vomiting Bleeding is moderate Pain controlled with Motrin Up ad lib / ambulatory / voiding without problems Newborn: breastfeeding  / Circumcision: planning   Objective:   VS: VS:  Filed Vitals:   05/28/14 1640 05/28/14 1804 05/28/14 2141 05/29/14 0606  BP: 126/78 129/78 114/66 118/70  Pulse: 121 111 89 70  Temp: 98.8 F (37.1 C) 99 F (37.2 C) 98.7 F (37.1 C) 98 F (36.7 C)  TempSrc: Oral Oral Oral Axillary  Resp: 18 18 16 16   Height:      Weight:      SpO2:        LABS:  Recent Labs  05/27/14 1940 05/29/14 0605  WBC 9.4 11.2*  HGB 10.9* 10.0*  PLT 203 173   Blood type: A/Positive/-- (12/11 0000) Rubella: Immune (12/11 0000)                I&O: Intake/Output     07/06 0701 - 07/07 0700 07/07 0701 - 07/08 0700   Urine (mL/kg/hr) 1200 (0.5)    Blood 250 (0.1)    Total Output 1450     Net -1450            Physical Exam: Alert and oriented X3 Abdomen: soft, non-tender, non-distended  Fundus: firm, non-tender, U-1 Perineum: Well approximated, no significant erythema, edema, or drainage; healing well. Lochia: small Extremities: No edema, no calf pain or tenderness    Assessment: PPD #1 G2P2002/ S/P:spontaneous vaginal, 2nd degree laceration Tachycardia during pregnancy, delivered Doing well    Plan: Discontinue Atenolol Possible discharge home later pending baby d/c RX's:  Ibuprofen 600mg  po Q 6 hrs prn pain #30 Refill x 0 Niferex 150mg  po QD #30 Refill x 1 Routine pp visit in [employer] [facility] booklet given    Autumn Stephens, Autumn Stephens, N MSN, CNM 05/29/2014, 11:35 AM

## 2014-05-29 NOTE — Anesthesia Postprocedure Evaluation (Signed)
Anesthesia Post Note  Patient: Autumn Stephens  Procedure(s) Performed: * No procedures listed *  Anesthesia type: Epidural  Patient location: Mother/Baby  Post pain: Pain level controlled  Post assessment: Post-op Vital signs reviewed  Last Vitals:  Filed Vitals:   05/29/14 0606  BP: 118/70  Pulse: 70  Temp: 36.7 C  Resp: 16    Post vital signs: Reviewed  Level of consciousness:alert  Complications: No apparent anesthesia complications

## 2014-05-29 NOTE — Lactation Note (Signed)
This note was copied from the chart of Autumn Autumn Bucklermanda Weigand. Lactation Consultation Note: Follow up visit with mom. Baby is in the nursery for circ. Mom reports that baby has been latching well- was cluster feeding this morning. Nipples are a little tender but intact. Comfort gels given with instructions for use and cleaning. Reviewed normal behavior after circ. Experienced BF mom. Parents are hoping for DC todayNo questions at present. To call prn.  Patient Name: Autumn Stephens's Date: 05/29/2014 Reason for consult: Follow-up assessment   Maternal Data Formula Feeding for Exclusion: No Infant to breast within first hour of birth: Yes Has patient been taught Hand Expression?: Yes Does the patient have breastfeeding experience prior to this delivery?: Yes  Feeding Feeding Type: Breast Fed Length of feed: 20 min  LATCH Score/Interventions       Type of Nipple: Everted at rest and after stimulation  Comfort (Breast/Nipple): Filling, red/small blisters or bruises, mild/mod discomfort  Problem noted: Mild/Moderate discomfort Interventions (Mild/moderate discomfort): Comfort gels        Lactation Tools Discussed/Used     Consult Status Consult Status: Complete    Pamelia HoitWeeks, Jannis Atkins D 05/29/2014, 12:10 PM

## 2014-05-29 NOTE — Discharge Summary (Signed)
Obstetric Discharge Summary Reason for Admission: IOL for Beta Blocker use in pregnancy Prenatal Procedures: NST and Polyhydramnios, maternal tachycardia, presumed LGA Intrapartum Procedures: spontaneous vaginal delivery, GBS prophylaxis and Cytotec, Pitocin, AROM, and epidural Postpartum Procedures: none Complications-Operative and Postpartum: 2nd degree perineal laceration Hemoglobin  Date Value Ref Range Status  05/29/2014 10.0* 12.0 - 15.0 g/dL Final     HCT  Date Value Ref Range Status  05/29/2014 30.7* 36.0 - 46.0 % Final    Physical Exam:  General: alert and cooperative Lochia: appropriate Uterine Fundus: firm Incision: healing well, no significant drainage, no dehiscence, no significant erythema DVT Evaluation: No evidence of DVT seen on physical exam. Negative Homan's sign. No cords or calf tenderness. No significant calf/ankle edema.  Discharge Diagnoses: Term Pregnancy-delivered  Discharge Information: Date: 05/29/2014 Activity: pelvic rest Diet: routine Medications: PNV, Ibuprofen and Iron Condition: stable Instructions: refer to practice specific booklet Discharge to: home Follow-up Information   Follow up with Lenoard AdenAAVON,RICHARD J, MD. Schedule an appointment as soon as possible for a visit in 6 weeks.   Specialty:  Obstetrics and Gynecology   Contact information:   [address] Severe[address] [place] [id number] [phone]       Newborn Data: Live born female on 05/28/2015 Birth Weight: 8 lb 14.5 oz (4040 g) APGAR: 9, 9  Home with mother.  Autumn Stephens, N 05/29/2014, 10:43 PM

## 2014-05-31 ENCOUNTER — Ambulatory Visit (HOSPITAL_COMMUNITY)
Admission: RE | Admit: 2014-05-31 | Discharge: 2014-05-31 | Disposition: A | Discharge status: Home / Self Care | Payer: 59 | Source: Ambulatory Visit | Attending: Obstetrics and Gynecology | Admitting: Obstetrics and Gynecology

## 2014-06-06 ENCOUNTER — Ambulatory Visit (HOSPITAL_COMMUNITY)
Admission: RE | Admit: 2014-06-06 | Discharge: 2014-06-06 | Disposition: A | Discharge status: Home / Self Care | Payer: 59 | Source: Ambulatory Visit | Attending: Obstetrics and Gynecology | Admitting: Obstetrics and Gynecology

## 2014-06-06 NOTE — Lactation Note (Addendum)
Lactation Consult  Mother's reason for visit:  Follow-up for weight check and NS Appointment Notes:  Autumn Stephens is here for a weight check and to follow-up for NS use.  He latched easily but his lips had to be adjusted.  He transferred 2.5 ounces and mom reports he is feeding well and often won't take a supplement.  Feedings last approximately 15-20 minutes.  He fed well today with long jaw excursions though I did note his jaw was quivering at times from muscle fatigue. His weight gain is slow.  I recommended that mom pre-pump to take off some of the fore milk so that Autumn Stephens will get more fat. Crematicrit was 27.1 with a normal range of 22-61g/dL  I suspect he is filling up on low calorie Milk.  I also recommended that she offer him breast milk any time she has been offering the pacifier. Frenotomy planned for Monday but mom will call and see if she can get him in sooner. Consult:  Follow-Up Lactation Consultant:  Soyla Dryer  ________________________________________________________________________  [name] Name: Autumn Stephens.  Date of Birth: 05/28/2015  Pediatrician: Mariam Dollar Gender: female  Gestational Age: [redacted]w[redacted]d (At Birth)  Birth Weight: 8 lb 14.5 oz (4040 g)  Weight at Discharge: Weight: 8 lb 10.6 oz (3930 g) Date of Discharge: 05/29/2014  Filed Weights   05/28/14 1420 05/29/14 0013  Weight: 8 lb 14.5 oz (4040 g) 8 lb 10.6 oz (3930 g)  [facility] 8+3 at [facility]  Weight today: 8+2.5   ________________________________________________________________________  Mother's Name: Alonna Buckler Type of delivery:  vaginal Breastfeeding Experience:  Pump for 10 months with the first child Maternal Medical Conditions:  na Maternal Medications:  PNV  ________________________________________________________________________  Breastfeeding History (Post Discharge)  Frequency of breastfeeding:  About every 3 hours Duration of feeding:  20  minutes  Supplementation  Breastmilk:  Volume 60 ml Frequency:  Offers 3 times Total volume per day:  180 ml  Method:  Bottle,   Pumping  Type of pump:  Medela pump in style Frequency:  3-4 times a day Volume:  12-16 ounces per day  Infant Intake and Output Assessment  Voids:  6 + 24 hrs.  Color:  Clear yellow Stools:  6+ 24 hrs.  Color:  Yellow  ________________________________________________________________________  Maternal Breast Assessment  Breast:  Soft  Nipple:  Erect Pain level:  "1" better because using shield and all purpose nipple ointment Pain interventions:  All purpose nipple cream and Expressed breast milk  _______________________________________________________________________ Feeding Assessment/Evaluation  Initial feeding assessment:  Infant's oral assessment:  Variance Lingual frenum is pulling his tongue to the of his mouth. Labial frenum wraps around the upper alveolar ridge.  Positioning:  Cross cradle Right breast  LATCH documentation:  Latch:  2 = Grasps breast easily, tongue down, lips flanged, rhythmical sucking.  Audible swallowing:  2 = Spontaneous and intermittent  Type of nipple:  2 = Everted at rest and after stimulation  Comfort (Breast/Nipple):  2 = Soft / non-tender  Hold (Positioning):  2 = No assistance needed to correctly position infant at breast  LATCH score:  10  Attached assessment:  Deep with NS   Lips untucked:  Had to flange and untuck  Suck assessment:    Tools:  Nipple shield 24 mm switched to a #20 Instructed on use and cleaning of tool:    Pre-feed weight:  3700 g   Post-feed weight:  3772 g  Amount transferred:  72 ml Amount supplemented:  0 ml  Additional Feeding Assessment - Repostioned on rt breast to try and increase hindmilk intake  Infant's oral assessment:  Tools:  Nipple shield 20 mm Instructed on use and cleaning of tool:  Yes.    Nursed briefly on the first breast a second time but baby was  changed prior to feeding so unable to get a post weight.    Total amount pumped post feed:     L 90 ml  Total amount transferred:  72 ml Total supplement given:  10 ml

## 2014-06-12 ENCOUNTER — Ambulatory Visit (HOSPITAL_COMMUNITY)
Admission: RE | Admit: 2014-06-12 | Discharge: 2014-06-12 | Disposition: A | Discharge status: Home / Self Care | Payer: 59 | Source: Ambulatory Visit | Attending: Obstetrics and Gynecology | Admitting: Obstetrics and Gynecology

## 2014-06-12 NOTE — Lactation Note (Signed)
Lactation Consult  Mother's reason for visit: follow up from post revision with feeding assessment Visit Type: feeding assessment Appointment Notes: Mother had infants frenula clipped  yesterday at Dr Kathi Der in [facility] Consult:  Follow-Up Lactation Consultant:  Michel Bickers  ________________________________________________________________________  Autumn Stephens Name: Autumn Stephens.  Date of Birth: 05/28/2015  Pediatrician: Mariam Dollar  Gender: female  Gestational Age: [redacted]w[redacted]d (At Birth)  Birth Weight: 8 lb 14.5 oz (4040 g)  Weight at Discharge: Weight: 8 lb 10.6 oz (3930 g) Date of Discharge: 05/29/2014  Filed Weights    05/28/14 1420  05/29/14 0013   Weight:  8 lb 14.5 oz (4040 g)  8 lb 10.6 oz (3930 g)   [facility]: 8+3 at ped , Peds on Thursday 8-5, on 7-16 Weight today: 8-11.8, 3964   ________________________________________________________________________  Mother's Name: Autumn Stephens Type of delivery:  Vaginal del Breastfeeding Experience: pumped for 10 months with last child. Mother states she took Domperidone to increase milk supply with first child Maternal Medical Conditions:  none Maternal Medications: Prenatat Vits  ________________________________________________________________________  Breastfeeding History (Post Discharge)  Frequency of breastfeeding: every 2-3 hours Duration of feeding:  20 mins on one side  Supplementation  Formula:  Volume none       Brand: none  Breastmilk:  Volume 2 ounces Frequency:  2-3 times daily  Method:  Bottle  Infant Intake and Output Assessment  Voids:  10-12 in 24 hrs.  Color:  Clear yellow Stools:  10 in 24 hrs.  Color:  Yellow  ________________________________________________________________________  Maternal Breast Assessment  Breast:  Full Nipple:  Erect Pain level:  0 Pain interventions:  Nipple shield, using a  #20  _______________________________________________________________________ Feeding Assessment/Evaluation Mothers nipples healed with tissue intact. Mothers breast are firm and full  Initial feeding assessment: infant was placed in cross cradle hold. Mother independently latched infant. Infant sustianed latch for 25 mins with good burst of suckling and swallowing. Infant transferred 15 m fro the first breast and another 36 from alternate breast.   Infant's oral assessment:  Healing from frenotomy  Positioning:  Cross cradle Left breast  LATCH documentation:  Latch:  2 = Grasps breast easily, tongue down, lips flanged, rhythmical sucking.  Audible swallowing:  2 = Spontaneous and intermittent  Type of nipple:  2 = Everted at rest and after stimulation  Comfort (Breast/Nipple):  1 = Filling, red/small blisters or bruises, mild/mod discomfort  Hold (Positioning):  2 = No assistance needed to correctly position infant at breast  LATCH score:  9  Attached assessment:  Shallow  Lips flanged:  Yes.    Lips untucked:  Yes.    Suck assessment:  Nutritive  Tools:  None with this feeding Instructed on use and cleaning of tool:  Yes.    Pre-feed weight:  3964g  (8 lb. 11.8 oz.) Post-feed weight:  4046 g (8 lb. 14.8  oz.) Amount transferred:  82 ml Amount supplemented:  None ml   Infant's oral assessment:  Healing from frenotomy  Positioning:  Football Right breast  LATCH documentation:  Latch:  2 = Grasps breast easily, tongue down, lips flanged, rhythmical sucking.  Audible swallowing:  2 = Spontaneous and intermittent  Type of nipple:  2 = Everted at rest and after stimulation  Comfort (Breast/Nipple):  1 = Filling, red/small blisters or bruises, mild/mod discomfort  Hold (Positioning):  2 = No assistance needed to correctly position infant at breast  LATCH score: 9  Attached assessment:  Shallow  Lips flanged:  Yes.    Lips untucked:  Yes.    Suck assessment:   Nutritive  Tools:  none Instructed on use and cleaning of tool:  none  Pre-feed weight:  4046 g  (8 lb. 14.8 oz.) Post-feed weight:  4082 g (9 lb. 0 oz.) Amount transferred:  36ml Amount supplemented: none ml   Total amount pumped post feed: none  Total amount transferred: 118 ml None Advised mother to continue to cue base feed. Mother to offer breast without the nipple shield.  May use the nipple shield if nipples are tender or broken down with improper latch Mother to continue to post pump as desired 2-3 times daily Follow up in one week for weight check with Dr Mariam DollarKearns or [facility] Lactation services prn

## 2014-06-26 ENCOUNTER — Emergency Department (HOSPITAL_BASED_OUTPATIENT_CLINIC_OR_DEPARTMENT_OTHER)
Admission: EM | Admit: 2014-06-26 | Discharge: 2014-06-26 | Disposition: A | Discharge status: Home / Self Care | Payer: 59 | Source: Home / Self Care | Attending: Emergency Medicine | Admitting: Emergency Medicine

## 2014-06-26 ENCOUNTER — Encounter (HOSPITAL_BASED_OUTPATIENT_CLINIC_OR_DEPARTMENT_OTHER): Payer: Self-pay | Admitting: Emergency Medicine

## 2014-06-26 ENCOUNTER — Emergency Department (HOSPITAL_BASED_OUTPATIENT_CLINIC_OR_DEPARTMENT_OTHER): Payer: 59

## 2014-06-26 ENCOUNTER — Encounter (HOSPITAL_COMMUNITY): Payer: Self-pay | Admitting: *Deleted

## 2014-06-26 ENCOUNTER — Inpatient Hospital Stay (HOSPITAL_COMMUNITY)
Admission: AD | Admit: 2014-06-26 | Discharge: 2014-06-29 | DRG: 776 | Disposition: A | Discharge status: Home / Self Care | Payer: 59 | Source: Ambulatory Visit | Attending: Obstetrics & Gynecology | Admitting: Obstetrics & Gynecology

## 2014-06-26 DIAGNOSIS — Z791 Long term (current) use of non-steroidal anti-inflammatories (NSAID): Secondary | ICD-10-CM

## 2014-06-26 DIAGNOSIS — Z3202 Encounter for pregnancy test, result negative: Secondary | ICD-10-CM | POA: Insufficient documentation

## 2014-06-26 DIAGNOSIS — N12 Tubulo-interstitial nephritis, not specified as acute or chronic: Secondary | ICD-10-CM | POA: Diagnosis present

## 2014-06-26 DIAGNOSIS — R109 Unspecified abdominal pain: Secondary | ICD-10-CM | POA: Insufficient documentation

## 2014-06-26 DIAGNOSIS — R10A0 Flank pain, unspecified side: Secondary | ICD-10-CM | POA: Diagnosis present

## 2014-06-26 DIAGNOSIS — R10A1 Flank pain, right side: Secondary | ICD-10-CM

## 2014-06-26 DIAGNOSIS — Z8719 Personal history of other diseases of the digestive system: Secondary | ICD-10-CM | POA: Insufficient documentation

## 2014-06-26 DIAGNOSIS — Z833 Family history of diabetes mellitus: Secondary | ICD-10-CM

## 2014-06-26 DIAGNOSIS — N3001 Acute cystitis with hematuria: Secondary | ICD-10-CM

## 2014-06-26 DIAGNOSIS — K589 Irritable bowel syndrome without diarrhea: Secondary | ICD-10-CM | POA: Diagnosis present

## 2014-06-26 DIAGNOSIS — R1031 Right lower quadrant pain: Secondary | ICD-10-CM | POA: Insufficient documentation

## 2014-06-26 DIAGNOSIS — Z8619 Personal history of other infectious and parasitic diseases: Secondary | ICD-10-CM | POA: Insufficient documentation

## 2014-06-26 DIAGNOSIS — N3000 Acute cystitis without hematuria: Secondary | ICD-10-CM | POA: Insufficient documentation

## 2014-06-26 DIAGNOSIS — Z8679 Personal history of other diseases of the circulatory system: Secondary | ICD-10-CM | POA: Insufficient documentation

## 2014-06-26 DIAGNOSIS — G43909 Migraine, unspecified, not intractable, without status migrainosus: Secondary | ICD-10-CM | POA: Diagnosis present

## 2014-06-26 DIAGNOSIS — O2390 Unspecified genitourinary tract infection in pregnancy, unspecified trimester: Principal | ICD-10-CM | POA: Diagnosis present

## 2014-06-26 LAB — CBC WITH DIFFERENTIAL/PLATELET
BASOS ABS: 0 10*3/uL (ref 0.0–0.1)
BASOS PCT: 0 % (ref 0–1)
Basophils Absolute: 0 10*3/uL (ref 0.0–0.1)
Basophils Relative: 0 % (ref 0–1)
EOS PCT: 1 % (ref 0–5)
Eosinophils Absolute: 0.1 10*3/uL (ref 0.0–0.7)
Eosinophils Absolute: 0 10*3/uL (ref 0.0–0.7)
Eosinophils Relative: 0 % (ref 0–5)
HCT: 41.7 % (ref 36.0–46.0)
HCT: 36.8 % (ref 36.0–46.0)
Hemoglobin: 13.8 g/dL (ref 12.0–15.0)
Hemoglobin: 12.3 g/dL (ref 12.0–15.0)
Lymphocytes Relative: 14 % (ref 12–46)
Lymphocytes Relative: 8 % — ABNORMAL LOW (ref 12–46)
Lymphs Abs: 1.4 10*3/uL (ref 0.7–4.0)
Lymphs Abs: 0.9 10*3/uL (ref 0.7–4.0)
MCH: 30.5 pg (ref 26.0–34.0)
MCH: 30.8 pg (ref 26.0–34.0)
MCHC: 33.1 g/dL (ref 30.0–36.0)
MCHC: 33.4 g/dL (ref 30.0–36.0)
MCV: 92.3 fL (ref 78.0–100.0)
MCV: 92.2 fL (ref 78.0–100.0)
MONO ABS: 0.7 10*3/uL (ref 0.1–1.0)
Monocytes Absolute: 1 10*3/uL (ref 0.1–1.0)
Monocytes Relative: 7 % (ref 3–12)
Monocytes Relative: 9 % (ref 3–12)
Neutro Abs: 7.7 10*3/uL (ref 1.7–7.7)
Neutro Abs: 9.3 10*3/uL — ABNORMAL HIGH (ref 1.7–7.7)
Neutrophils Relative %: 78 % — ABNORMAL HIGH (ref 43–77)
Neutrophils Relative %: 83 % — ABNORMAL HIGH (ref 43–77)
PLATELETS: 205 10*3/uL (ref 150–400)
Platelets: 173 10*3/uL (ref 150–400)
RBC: 4.52 MIL/uL (ref 3.87–5.11)
RBC: 3.99 MIL/uL (ref 3.87–5.11)
RDW: 14.2 % (ref 11.5–15.5)
RDW: 14.3 % (ref 11.5–15.5)
WBC: 9.9 10*3/uL (ref 4.0–10.5)
WBC: 11.3 10*3/uL — ABNORMAL HIGH (ref 4.0–10.5)

## 2014-06-26 LAB — URINE MICROSCOPIC-ADD ON

## 2014-06-26 LAB — URINALYSIS, ROUTINE W REFLEX MICROSCOPIC
BILIRUBIN URINE: NEGATIVE
BILIRUBIN URINE: NEGATIVE
Glucose, UA: NEGATIVE mg/dL
Glucose, UA: NEGATIVE mg/dL
KETONES UR: NEGATIVE mg/dL
Ketones, ur: NEGATIVE mg/dL
Nitrite: NEGATIVE
Nitrite: POSITIVE — AB
PH: 6 (ref 5.0–8.0)
PROTEIN: 100 mg/dL — AB
Protein, ur: 100 mg/dL — AB
SPECIFIC GRAVITY, URINE: 1.013 (ref 1.005–1.030)
Specific Gravity, Urine: 1.02 (ref 1.005–1.030)
UROBILINOGEN UA: 0.2 mg/dL (ref 0.0–1.0)
Urobilinogen, UA: 0.2 mg/dL (ref 0.0–1.0)
pH: 5 (ref 5.0–8.0)

## 2014-06-26 LAB — BASIC METABOLIC PANEL
ANION GAP: 15 (ref 5–15)
BUN: 13 mg/dL (ref 6–23)
CO2: 23 mEq/L (ref 19–32)
Calcium: 10.1 mg/dL (ref 8.4–10.5)
Chloride: 104 mEq/L (ref 96–112)
Creatinine, Ser: 1.1 mg/dL (ref 0.50–1.10)
GFR calc Af Amer: 81 mL/min — ABNORMAL LOW (ref >90)
GFR, EST NON AFRICAN AMERICAN: 70 mL/min — AB (ref >90)
GLUCOSE: 89 mg/dL (ref 70–99)
Potassium: 4.3 mEq/L (ref 3.7–5.3)
SODIUM: 142 meq/L (ref 137–147)

## 2014-06-26 LAB — PREGNANCY, URINE: Preg Test, Ur: NEGATIVE

## 2014-06-26 MED ORDER — PHENAZOPYRIDINE HCL 100 MG PO TABS
100.0000 mg | ORAL_TABLET | Freq: Three times a day (TID) | ORAL | Status: DC
  Administered 2014-06-27 – 2014-06-29 (×7): 100 mg via ORAL
  Filled 2014-06-26 (×8): qty 1

## 2014-06-26 MED ORDER — CEPHALEXIN 500 MG PO CAPS: 500.0000 mg | ORAL_CAPSULE | Freq: Four times a day (QID) | ORAL | Status: DC

## 2014-06-26 MED ORDER — PROMETHAZINE HCL 25 MG/ML IJ SOLN
12.5000 mg | Freq: Four times a day (QID) | INTRAMUSCULAR | Status: DC | PRN
  Administered 2014-06-27: 12.5 mg via INTRAVENOUS
  Filled 2014-06-26: qty 1

## 2014-06-26 MED ORDER — OXYCODONE-ACETAMINOPHEN 5-325 MG PO TABS
2.0000 | ORAL_TABLET | ORAL | Status: DC | PRN
  Administered 2014-06-27 – 2014-06-28 (×3): 2 via ORAL
  Filled 2014-06-26 (×3): qty 2

## 2014-06-26 MED ORDER — PRENATAL MULTIVITAMIN CH
1.0000 | ORAL_TABLET | Freq: Every day | ORAL | Status: DC
  Administered 2014-06-27 – 2014-06-29 (×3): 1 via ORAL
  Filled 2014-06-26 (×3): qty 1

## 2014-06-26 MED ORDER — IBUPROFEN 600 MG PO TABS
600.0000 mg | ORAL_TABLET | Freq: Four times a day (QID) | ORAL | Status: DC
  Administered 2014-06-26 – 2014-06-29 (×9): 600 mg via ORAL
  Filled 2014-06-26 (×10): qty 1

## 2014-06-26 MED ORDER — KETOROLAC TROMETHAMINE 30 MG/ML IJ SOLN
30.0000 mg | Freq: Once | INTRAMUSCULAR | Status: AC
  Administered 2014-06-26: 30 mg via INTRAVENOUS
  Filled 2014-06-26: qty 1

## 2014-06-26 MED ORDER — SODIUM CHLORIDE 0.9 % IV SOLN
INTRAVENOUS | Status: DC
  Administered 2014-06-27 – 2014-06-28 (×7): via INTRAVENOUS

## 2014-06-26 MED ORDER — LACTATED RINGERS IV BOLUS (SEPSIS)
500.0000 mL | Freq: Once | INTRAVENOUS | Status: AC
  Administered 2014-06-26: 500 mL via INTRAVENOUS

## 2014-06-26 MED ORDER — DEXTROSE 5 % IV SOLN
1.0000 g | Freq: Two times a day (BID) | INTRAVENOUS | Status: DC
  Administered 2014-06-27 – 2014-06-29 (×6): 1 g via INTRAVENOUS
  Filled 2014-06-26 (×6): qty 10

## 2014-06-26 NOTE — MAU Provider Note (Signed)
History     CSN: [id number]  Arrival date and time: 06/26/14 2113 Provider here with patient arrival @ 2115    Chief Complaint  Patient presents with  . Flank Pain   HPI Back pain onset 8/3 pm able to rest, by next am 08/04 pain radiating to RLQ and down towards right pelvic crest.  Rated 9 out of 10 at worst, worse with ambulation, no relief, went to outside hospital received Toradol and Keflex in ED. Post return home was able to rest, awoke this evening with fever, chills, shivering, crying stated " it felt like a had a horrible flu." Nausea present, remained able to tolerate fluids. Intermittent radiation to LLQ. Dysuria, unable to fully empty bladder, urine appears bloody and malodorous. Has taken 4 ibuprofen, 1 percocet and 1 macrobid. Postpartum bleeding ceased Friday, scant abdominal cramping Saturday. Has not taken atenolol since postpartum. Denies changes to breastfeeding ,lumps, masses or warmth.   Past Medical History  Diagnosis Date  . H/O varicella   . Migraine, unspecified, without mention of intractable migraine without mention of status migrainosus   . Irritable bowel syndrome   . Diffuse cystic mastopathy   . Other malaise and fatigue   . Tachycardia   . NSVD (normal spontaneous vaginal delivery - 11/5) 09/28/2012  . Postpartum care following vaginal delivery 09/28/2012  . Perineal laceration, first degree 09/28/2012  . Maternal anemia complicating pregnancy, childbirth, or the puerperium 09/28/2012  . Rubella nonimmune status, delivered, current hospitalization 09/28/2012    Past Surgical History  Procedure Laterality Date  . No past surgeries      Family History  Problem Relation Age of Onset  . Cancer Mother     skin  . Rheum arthritis Father   . Diabetes Maternal Grandfather   . Cancer Maternal Grandfather     prostate    History  Substance Use Topics  . Smoking status: Never Smoker   . Smokeless tobacco: Never Used  . Alcohol Use: No    Allergies:   Allergies  Allergen Reactions  . Augmentin [Amoxicillin-Pot Clavulanate] Nausea And Vomiting  . Avelox [Moxifloxacin Hcl In Nacl] Nausea And Vomiting  . Biaxin [Clarithromycin] Nausea And Vomiting    Prescriptions prior to admission  Medication Sig Dispense Refill  . cephALEXin (KEFLEX) 500 MG capsule Take 1 capsule (500 mg total) by mouth 4 (four) times daily.  28 capsule  0  . ibuprofen (ADVIL,MOTRIN) 600 MG tablet Take 1 tablet (600 mg total) by mouth every 6 (six) hours.  30 tablet  0  . Prenatal Vit-Fe Fumarate-FA (PRENATAL MULTIVITAMIN) TABS Take 1 tablet by mouth daily.        ROS  Physical Exam   Blood pressure 118/72, pulse 130, temperature 101.5 F (38.6 C), temperature source Oral, resp. rate 18, height 5' 7.7" (1.72 m), weight 96.072 kg (211 lb 12.8 oz), SpO2 97.00%, unknown if currently breastfeeding.  Physical Exam  General: Alert and oriented in NAD but appears in pain CV: Regular rate and rhythm Breast: Non-tender, no nodules, no masses noted Respiratory: Clear to auscultation  anterior and posteriorly GI: Abdomen soft, tender right lower quadrant /  non-distended / no rebound CVA + right  / negative left  Labs: [place] ED: Urinalysis: 1013 sp grav / negative nitrites / + WBC / + heme / + protein   (no culture sent) CBC: wbc 9.9 / hgb 13.8 / hct 41.7 / plt 205 Creatinine 1.10 / potassium 4.3  Repeat labs today in MAU:  Urinalysis: 1020 sp grav / POS nitrites / + WBC / + heme / + protein CBC: wbc 11.3 / hgb 12.3 / hct 36.8 / plt 173   CT EXAM results from 8/4 - [facility] ED:  CLINICAL DATA: Right flank and abdominal pain. The patient gave birth 4 weeks ago.   EXAM:  CT ABDOMEN AND PELVIS WITHOUT CONTRAST  TECHNIQUE: Multidetector CT imaging of the abdomen and pelvis was performed without IVcontrast.  COMPARISON: MRI pelvis 12/16/2009.  FINDINGS:  The lung bases are clear without focal nodule, mass, or airspace  disease. The heart size is normal. No  significant pleural or  pericardial effusion is present.  The liver and spleen are within normal limits. The stomach,  duodenum, and pancreas are within normal limits. Common bile duct  and gallbladder are normal. The adrenal glands are normal  bilaterally. The kidneys and ureters are unremarkable. There is no  nephrolithiasis or ureteral obstruction.  The rectosigmoid colon is within normal limits. The remainder the  colon is normal as well. The appendix is visualized and within  normal limits. Small bowel is unremarkable.  The uterus and adnexa are normal.  The bone windows are unremarkable.   IMPRESSION:  1. No acute or focal lesion to explain the patient's symptoms.   MAU Course  Procedures   Assessment and Plan  UTI with ascending pyelonephritis / febrile  1) IVF hydration 2) IV ABX for UTI / ascending infection 3) pain management 4) admit 24-72 hours for IV treatment pending culture results  Marlinda Mike 06/26/2014, 9:40 PM

## 2014-06-26 NOTE — ED Notes (Signed)
Patient c/o R side pelvic pain that radiates to back & side,, some nausea

## 2014-06-26 NOTE — H&P (Signed)
  OB ADMISSION/ HISTORY & PHYSICAL:  Admission Date: 06/26/2014  9:13 PM  Admit Diagnosis: UTI - ascending with pyelonephritis / lactating postpartum 1 month  Autumn Stephens is a 23 y.o. female presenting for fever / flank pain /worsening UTI symptoms.  Prenatal History: [id number]   SVD with first degree laceration repair 05/28/2014 Breastfeeding / pumping every 3 hours  Medical / Surgical History :  Past medical history:  Past Medical History  Diagnosis Date  . H/O varicella   . Migraine, unspecified, without mention of intractable migraine without mention of status migrainosus   . Irritable bowel syndrome   . Diffuse cystic mastopathy   . Other malaise and fatigue   . Tachycardia   . NSVD (normal spontaneous vaginal delivery - 11/5) 09/28/2012  . Postpartum care following vaginal delivery 09/28/2012  . Perineal laceration, first degree 09/28/2012  . Maternal anemia complicating pregnancy, childbirth, or the puerperium 09/28/2012  . Rubella nonimmune status, delivered, current hospitalization 09/28/2012     Past surgical history:  Past Surgical History  Procedure Laterality Date  . No past surgeries      Family History:  Family History  Problem Relation Age of Onset  . Cancer Mother     skin  . Rheum arthritis Father   . Diabetes Maternal Grandfather   . Cancer Maternal Grandfather     prostate     Social History:  reports that she has never smoked. She has never used smokeless tobacco. She reports that she does not drink alcohol or use illicit drugs.   Allergies: Augmentin; Avelox; and Biaxin    Current Medications at time of admission:  Prior to Admission medications   Medication Sig Start Date End Date Taking? Authorizing Provider  cephALEXin (KEFLEX) 500 MG capsule Take 1 capsule (500 mg total) by mouth 4 (four) times daily. 06/26/14  Yes Geoffery Lyonsouglas Delo, MD  ibuprofen (ADVIL,MOTRIN) 200 MG tablet Take 800 mg by mouth every 6 (six) hours as needed for moderate pain.    Yes Historical Provider, MD  nitrofurantoin, macrocrystal-monohydrate, (MACROBID) 100 MG capsule Take 100 mg by mouth once.   Yes Historical Provider, MD  oxyCODONE-acetaminophen (PERCOCET/ROXICET) 5-325 MG per tablet Take 1 tablet by mouth every 4 (four) hours as needed for severe pain.   Yes Historical Provider, MD  Prenatal Vit-Fe Fumarate-FA (PRENATAL MULTIVITAMIN) TABS Take 1 tablet by mouth daily.   Yes Historical Provider, MD   Review of Systems: Nausea this afternoon - no vomiting Right flank pain Right lower abdominal pain Fever onset ~ 1800  Physical Exam:  VS: Blood pressure 118/72, pulse 130, temperature 101.5 F (38.6 C), temperature source Oral, resp. rate 18, height 5' 7.7" (1.72 m), weight 96.072 kg (211 lb 12.8 oz), SpO2 97.00%, unknown if currently breastfeeding.  General: alert and oriented, appears general malaise Heart: RRR Lungs: Clear lung fields Breast: soft / no engorgement Abdomen:soft, non-distended, right lower quadrant tenderness / fundus not palpable above pelvic rim CVA - mild right Extremities: no edema  Assessment: UTI - ascending with pyelonephritis Lactating postpartum 1 month  Plan:  Admit IV ABX Rocephin 1gm IV Q12 ( 24-72 hours) pending urine culture results Pain management: motrin / percocet / pyridium IVF hydration  Dr Juliene PinaMody notified of admission / plan of care   Marlinda MikeBAILEY, Autumn Stephens CNM, MSN, [facility]ACNM 06/26/2014, 10:41 PM

## 2014-06-26 NOTE — MAU Note (Signed)
Pt went to [facility] for kidney infection. Given Keflex. Took that. When she got home had a fever and took a percocet and ibuprofen around 7pm. Called Marlinda Mikeanya Bailey CNM who told her to come in.

## 2014-06-26 NOTE — Discharge Instructions (Signed)
Keflex as prescribed.  Tylenol 1000 mg every 6 hours as needed for pain.  Return to the emergency department if he develops high fever, vomiting with inability to keep your medications down, or other new and concerning symptoms.   Urinary Tract Infection Urinary tract infections (UTIs) can develop anywhere along your urinary tract. Your urinary tract is your body's drainage system for removing wastes and extra water. Your urinary tract includes two kidneys, two ureters, a bladder, and a urethra. Your kidneys are a pair of bean-shaped organs. Each kidney is about the size of your fist. They are located below your ribs, one on each side of your spine. CAUSES Infections are caused by microbes, which are microscopic organisms, including fungi, viruses, and bacteria. These organisms are so small that they can only be seen through a microscope. Bacteria are the microbes that most commonly cause UTIs. SYMPTOMS  Symptoms of UTIs may vary by age and gender of the patient and by the location of the infection. Symptoms in young women typically include a frequent and intense urge to urinate and a painful, burning feeling in the bladder or urethra during urination. Older women and men are more likely to be tired, shaky, and weak and have muscle aches and abdominal pain. A fever may mean the infection is in your kidneys. Other symptoms of a kidney infection include pain in your back or sides below the ribs, nausea, and vomiting. DIAGNOSIS To diagnose a UTI, your caregiver will ask you about your symptoms. Your caregiver also will ask to provide a urine sample. The urine sample will be tested for bacteria and white blood cells. White blood cells are made by your body to help fight infection. TREATMENT  Typically, UTIs can be treated with medication. Because most UTIs are caused by a bacterial infection, they usually can be treated with the use of antibiotics. The choice of antibiotic and length of treatment depend  on your symptoms and the type of bacteria causing your infection. HOME CARE INSTRUCTIONS  If you were prescribed antibiotics, take them exactly as your caregiver instructs you. Finish the medication even if you feel better after you have only taken some of the medication.  Drink enough water and fluids to keep your urine clear or pale yellow.  Avoid caffeine, tea, and carbonated beverages. They tend to irritate your bladder.  Empty your bladder often. Avoid holding urine for long periods of time.  Empty your bladder before and after sexual intercourse.  After a bowel movement, women should cleanse from front to back. Use each tissue only once. SEEK MEDICAL CARE IF:   You have back pain.  You develop a fever.  Your symptoms do not begin to resolve within 3 days. SEEK IMMEDIATE MEDICAL CARE IF:   You have severe back pain or lower abdominal pain.  You develop chills.  You have nausea or vomiting.  You have continued burning or discomfort with urination. MAKE SURE YOU:   Understand these instructions.  Will watch your condition.  Will get help right away if you are not doing well or get worse. Document Released: 08/19/2005 Document Revised: 05/10/2012 Document Reviewed: 12/18/2011 [facility] Patient Information 2015 [employer] [place]. This information is not intended to replace advice given to you by your health care provider. Make sure you discuss any questions you have with your health care provider.

## 2014-06-26 NOTE — ED Provider Notes (Signed)
CSN: [id number]     Arrival date & time 06/26/14  0944 History   First MD Initiated Contact with Patient 06/26/14 1000     Chief Complaint  Patient presents with  . Abdominal Pain     (Consider location/radiation/quality/duration/timing/severity/associated sxs/prior Treatment) HPI Comments: Patient is a 23 year old female who presents with complaints of right lower quadrant pain. She is one month status post vaginal delivery. She denies any vaginal bleeding or discharge. She denies any fevers or chills. She does state that she has felt like she has potentially had a UTI for the past several days, then her pain began yesterday.  Patient is a 23 y.o. female presenting with abdominal pain. The history is provided by the patient.  Abdominal Pain Pain location:  RLQ Pain quality: cramping   Pain radiates to:  Does not radiate Pain severity:  Severe Onset quality:  Sudden Duration:  1 day Timing:  Constant Progression:  Worsening Relieved by:  Nothing Worsened by:  Movement and palpation Ineffective treatments:  None tried Associated symptoms: no chills, no diarrhea, no fatigue, no fever and no hematuria     Past Medical History  Diagnosis Date  . H/O varicella   . Migraine, unspecified, without mention of intractable migraine without mention of status migrainosus   . Irritable bowel syndrome   . Diffuse cystic mastopathy   . Other malaise and fatigue   . Tachycardia   . NSVD (normal spontaneous vaginal delivery - 11/5) 09/28/2012  . Postpartum care following vaginal delivery 09/28/2012  . Perineal laceration, first degree 09/28/2012  . Maternal anemia complicating pregnancy, childbirth, or the puerperium 09/28/2012  . Rubella nonimmune status, delivered, current hospitalization 09/28/2012   Past Surgical History  Procedure Laterality Date  . No past surgeries     Family History  Problem Relation Age of Onset  . Cancer Mother     skin  . Rheum arthritis Father   . Diabetes  Maternal Grandfather   . Cancer Maternal Grandfather     prostate   History  Substance Use Topics  . Smoking status: Never Smoker   . Smokeless tobacco: Never Used  . Alcohol Use: No   OB History   Grav Para Term Preterm Abortions TAB SAB Ect Mult Living   2 2 2  0 0 0 0 0 0 2     Review of Systems  Constitutional: Negative for fever, chills and fatigue.  Gastrointestinal: Positive for abdominal pain. Negative for diarrhea.  Genitourinary: Negative for hematuria.  All other systems reviewed and are negative.     Allergies  Augmentin; Avelox; and Biaxin  Home Medications   Prior to Admission medications   Medication Sig Start Date End Date Taking? Authorizing Provider  ibuprofen (ADVIL,MOTRIN) 600 MG tablet Take 1 tablet (600 mg total) by mouth every 6 (six) hours. 05/29/14   Lawernce PittsMelanie N Bhambri, CNM  Prenatal Vit-Fe Fumarate-FA (PRENATAL MULTIVITAMIN) TABS Take 1 tablet by mouth daily.    Historical Provider, MD   BP 139/84  Pulse 90  Temp(Src) 98.2 F (36.8 C) (Oral)  Resp 20  SpO2 100% Physical Exam  Nursing note and vitals reviewed. Constitutional: She is oriented to person, place, and time. She appears well-developed and well-nourished. No distress.  HENT:  Head: Normocephalic and atraumatic.  Neck: Normal range of motion. Neck supple.  Cardiovascular: Normal rate and regular rhythm.  Exam reveals no gallop and no friction rub.   No murmur heard. Pulmonary/Chest: Effort normal and breath sounds normal. No respiratory distress.  She has no wheezes.  Abdominal: Soft. Bowel sounds are normal. She exhibits no distension and no mass. There is tenderness. There is no rebound and no guarding.  There is tenderness to palpation in the right lower quadrant.  Musculoskeletal: Normal range of motion.  Neurological: She is alert and oriented to person, place, and time.  Skin: Skin is warm and dry. She is not diaphoretic.    ED Course  Procedures (including critical care  time) Labs Review Labs Reviewed  URINALYSIS, ROUTINE W REFLEX MICROSCOPIC  PREGNANCY, URINE  CBC WITH DIFFERENTIAL  BASIC METABOLIC PANEL    Imaging Review No results found.   EKG Interpretation None      MDM   Final diagnoses:  None    Patient presents with complaints of right flank pain and dysuria for the past several days. Workup reveals evidence for a UTI, however CT scan does not reveal a renal calculus. She appears more comfortable and I feel as though she is appropriate for discharge with antibiotics and pain medication.    Geoffery Lyons, MD 06/26/14 [phone]

## 2014-06-27 MED ORDER — TAMSULOSIN HCL 0.4 MG PO CAPS
0.4000 mg | ORAL_CAPSULE | Freq: Every day | ORAL | Status: DC
  Administered 2014-06-27 – 2014-06-29 (×2): 0.4 mg via ORAL
  Filled 2014-06-27 (×3): qty 1

## 2014-06-27 NOTE — Progress Notes (Signed)
Patient ID: Autumn Stephens, female   DOB: 11/03/1991, 23 y.o.   MRN: [id number]  Subjective: Back pain, flank pain, lower pelvic cramps and pain. No n/v. No fever but got chills last night. Not feeling well still. No renal stone hx, [facility]  Objective: Vital signs in last 24 hours: Temp:  [98 F (36.7 C)-101.5 F (38.6 C)] 98.8 F (37.1 C) (08/05 1400) Pulse Rate:  [77-130] 77 (08/05 1400) Resp:  [18] 18 (08/05 1400) BP: (98-118)/(55-72) 98/55 mmHg (08/05 1400) SpO2:  [97 %-99 %] 97 % (08/05 1400) Weight:  [211 lb 12.8 oz (96.072 kg)] 211 lb 12.8 oz (96.072 kg) (08/04 2122) Weight change:  Last BM Date: 06/25/14  Intake/Output from previous day: 08/04 0701 - 08/05 0700 In: -  Out: 375 [Urine:375] Intake/Output this shift: Total I/O In: -  Out: 300 [Urine:300]  Physical exam:  A&O x 3, no acute distress. Pleasant HEENT neg, no thyromegaly Lungs CTA bilat CV RRR, S1S2 normal Abdo soft, non tender, non acute, slight lower abdominal tenderness . Rt CVA tenderness++ Extr no edema/ tenderness Pelvic deferred  Lab Results:  Recent Labs  06/26/14 1050 06/26/14 2305  WBC 9.9 11.3*  HGB 13.8 12.3  HCT 41.7 36.8  PLT 205 173   BMET  Recent Labs  06/26/14 1020  NA 142  K 4.3  CL 104  CO2 23  GLUCOSE 89  BUN 13  CREATININE 1.10  CALCIUM 10.1    Studies/Results: Ct Abdomen Pelvis Wo Contrast  06/26/2014   CLINICAL DATA:  Right flank and abdominal pain. The patient gave birth 4 weeks ago.  EXAM: CT ABDOMEN AND PELVIS WITHOUT CONTRAST  TECHNIQUE: Multidetector CT imaging of the abdomen and pelvis was performed following the standard protocol without IV contrast.  COMPARISON:  MRI pelvis 12/16/2009.  FINDINGS: The lung bases are clear without focal nodule, mass, or airspace disease. The heart size is normal. No significant pleural or pericardial effusion is present.  The liver and spleen are within normal limits. The stomach, duodenum, and pancreas are  within normal limits. Common bile duct and gallbladder are normal. The adrenal glands are normal bilaterally. The kidneys and ureters are unremarkable. There is no nephrolithiasis or ureteral obstruction.  The rectosigmoid colon is within normal limits. The remainder the colon is normal as well. The appendix is visualized and within normal limits. Small bowel is unremarkable.  The uterus and adnexa are normal.  The bone windows are unremarkable.  IMPRESSION: 1. No acute or focal lesion to explain the patient's symptoms.   Electronically Signed   By: Gennette Pachris  Mattern M.D.   On: 06/26/2014 12:31    Assessment/Plan:  LOS: 1 day  UTI, Pyelonephritis, continue Rocephin x48 hrs Pain management- Percocet, Ibuprofen and adding Flomax.   Rease Wence R 06/27/2014, 2:58 PM

## 2014-06-27 NOTE — H&P (Signed)
Reviewed and agree with note and plan. V.Chara Marquard, MD  

## 2014-06-27 NOTE — MAU Provider Note (Signed)
Reviewed and agree with note and plan. V.Darral Rishel, MD  

## 2014-06-28 LAB — URINE CULTURE
Colony Count: >=100000
Special Requests: NORMAL

## 2014-06-28 NOTE — Progress Notes (Addendum)
Patient ID: Alonna Bucklermanda Mcvey, female   DOB: 07/15/1991, 23 y.o.   MRN: [id number] HD #2 S: Feels better. Back pain improved. No fever or chills. PRN percocet O: Patient Vitals for the past 24 hrs:  BP Temp Temp src Pulse Resp SpO2  06/28/14 0527 123/64 mmHg 99.2 F (37.3 C) Oral 87 17 95 %  06/27/14 2112 108/48 mmHg 98.6 F (37 C) Oral 92 16 96 %  06/27/14 1749 117/72 mmHg 98.4 F (36.9 C) Oral 98 18 99 %  06/27/14 1400 98/55 mmHg 98.8 F (37.1 C) Oral 77 18 97 %  06/27/14 1000 - 98.8 F (37.1 C) Oral - - -   WDWN WF Neck: supple with FROM Lungs: CTA CV: RRR Minimal CVAT Ext: no cords. Neuro : non focal Skin: intact  UCS: E. Coli  A: Acute Postpartum Pyelo Low back pain noted but no CVAT, ? MSK  P: Continue IV Abx Rpt CBC in am Likely dc in am

## 2014-06-29 LAB — CBC
HCT: 33.5 % — ABNORMAL LOW (ref 36.0–46.0)
Hemoglobin: 10.8 g/dL — ABNORMAL LOW (ref 12.0–15.0)
MCH: 30.1 pg (ref 26.0–34.0)
MCHC: 32.2 g/dL (ref 30.0–36.0)
MCV: 93.3 fL (ref 78.0–100.0)
PLATELETS: 131 10*3/uL — AB (ref 150–400)
RBC: 3.59 MIL/uL — ABNORMAL LOW (ref 3.87–5.11)
RDW: 14.3 % (ref 11.5–15.5)
WBC: 5.2 10*3/uL (ref 4.0–10.5)

## 2014-06-29 NOTE — Progress Notes (Signed)
Discharge instructions complete. Pt understood all instructions and did not have any questions. PT ambulated out of hospital and discharge home to family.

## 2014-06-29 NOTE — Progress Notes (Signed)
Patient ID: Autumn Stephens, female   DOB: 04/23/1991, 23 y.o.   MRN: [id number] HD #3 S: Feels better. Back pain improved. No fever or chills. PRN percocet O: Patient Vitals for the past 24 hrs:  BP Temp Temp src Pulse Resp SpO2  06/29/14 0537 148/86 mmHg 99.7 F (37.6 C) Oral 65 18 97 %  06/28/14 2125 125/82 mmHg 98 F (36.7 C) Oral 71 18 95 %  06/28/14 1812 126/85 mmHg 98 F (36.7 C) Oral 81 18 98 %  06/28/14 1200 128/74 mmHg 98.4 F (36.9 C) Oral 90 18 96 %   WDWN WF Neck: supple with FROM Lungs: CTA CV: RRR Minimal CVAT Ext: no cords. Neuro : non focal Skin: intact CBC    Component Value Date/Time   WBC 5.2 06/29/2014 0530   RBC 3.59* 06/29/2014 0530   HGB 10.8* 06/29/2014 0530   HCT 33.5* 06/29/2014 0530   PLT 131* 06/29/2014 0530   MCV 93.3 06/29/2014 0530   MCH 30.1 06/29/2014 0530   MCHC 32.2 06/29/2014 0530   RDW 14.3 06/29/2014 0530   LYMPHSABS 0.9 06/26/2014 2305   MONOABS 1.0 06/26/2014 2305   EOSABS 0.0 06/26/2014 2305   BASOSABS 0.0 06/26/2014 2305      UCS: E. Coli  A: Acute Postpartum Pyelo-clinically improved, afebrile, Leukocytosis resolved Low back pain noted but no CVAT, ? MSK  P: DC IV Abx after 10 am dose PO Keflex   Dc home Fu office one week

## 2014-06-30 NOTE — Discharge Summary (Signed)
NAMAlonna Buckler:  Stephens, Autumn Stephens              ACCOUNT NO.:  [id number]  MEDICAL RECORD NO.:  [id number]  LOCATION:  9306                          FACILITY:  [facility]  PHYSICIAN:  Lenoard Adenichard J. Langford Carias, M.D.DATE OF BIRTH:  06-08-1991  DATE OF ADMISSION:  06/26/2014 DATE OF DISCHARGE:  06/29/2014                              DISCHARGE SUMMARY   HOSPITAL COURSE:  The patient was admitted to acute postpartum pyelonephritis on June 26, 2014, was placed on IV antibiotics, improved symptomatically, became afebrile for greater than 24-48 hours.  She was discharged to home on hospital day #3.  DISCHARGE MEDICATIONS:  Include Keflex.  FOLLOWUP PLAN:  She is to follow up in the office within 1 week. Discharge teaching was done.     Lenoard Adenichard J. Chavis Tessler, M.D.     RJT/MEDQ  D:  06/30/2014  T:  06/30/2014  Job:  [id number]

## 2014-09-24 ENCOUNTER — Encounter (HOSPITAL_COMMUNITY): Payer: Self-pay | Admitting: *Deleted

## 2016-01-13 ENCOUNTER — Emergency Department (HOSPITAL_BASED_OUTPATIENT_CLINIC_OR_DEPARTMENT_OTHER)
Admission: EM | Admit: 2016-01-13 | Discharge: 2016-01-13 | Disposition: A | Discharge status: Home / Self Care | Payer: 59 | Attending: Emergency Medicine | Admitting: Emergency Medicine

## 2016-01-13 ENCOUNTER — Encounter (HOSPITAL_BASED_OUTPATIENT_CLINIC_OR_DEPARTMENT_OTHER): Payer: Self-pay

## 2016-01-13 DIAGNOSIS — Z8679 Personal history of other diseases of the circulatory system: Secondary | ICD-10-CM | POA: Diagnosis not present

## 2016-01-13 DIAGNOSIS — M791 Myalgia: Secondary | ICD-10-CM | POA: Diagnosis not present

## 2016-01-13 DIAGNOSIS — Z87448 Personal history of other diseases of urinary system: Secondary | ICD-10-CM | POA: Insufficient documentation

## 2016-01-13 DIAGNOSIS — Z8742 Personal history of other diseases of the female genital tract: Secondary | ICD-10-CM | POA: Diagnosis not present

## 2016-01-13 DIAGNOSIS — R197 Diarrhea, unspecified: Secondary | ICD-10-CM | POA: Insufficient documentation

## 2016-01-13 DIAGNOSIS — R5383 Other fatigue: Secondary | ICD-10-CM | POA: Insufficient documentation

## 2016-01-13 DIAGNOSIS — Z3202 Encounter for pregnancy test, result negative: Secondary | ICD-10-CM | POA: Insufficient documentation

## 2016-01-13 DIAGNOSIS — R109 Unspecified abdominal pain: Secondary | ICD-10-CM | POA: Insufficient documentation

## 2016-01-13 DIAGNOSIS — Z8619 Personal history of other infectious and parasitic diseases: Secondary | ICD-10-CM | POA: Diagnosis not present

## 2016-01-13 DIAGNOSIS — Z8719 Personal history of other diseases of the digestive system: Secondary | ICD-10-CM | POA: Insufficient documentation

## 2016-01-13 HISTORY — DX: Tubulo-interstitial nephritis, not specified as acute or chronic: N12

## 2016-01-13 LAB — CBC WITH DIFFERENTIAL/PLATELET
BASOS ABS: 0 10*3/uL (ref 0.0–0.1)
BASOS PCT: 0 %
EOS ABS: 0 10*3/uL (ref 0.0–0.7)
EOS PCT: 1 %
HCT: 43.7 % (ref 36.0–46.0)
HEMOGLOBIN: 14.1 g/dL (ref 12.0–15.0)
Lymphocytes Relative: 24 %
Lymphs Abs: 1 10*3/uL (ref 0.7–4.0)
MCH: 30.7 pg (ref 26.0–34.0)
MCHC: 32.3 g/dL (ref 30.0–36.0)
MCV: 95 fL (ref 78.0–100.0)
Monocytes Absolute: 0.5 10*3/uL (ref 0.1–1.0)
Monocytes Relative: 13 %
Neutro Abs: 2.6 10*3/uL (ref 1.7–7.7)
Neutrophils Relative %: 62 %
PLATELETS: 166 10*3/uL (ref 150–400)
RBC: 4.6 MIL/uL (ref 3.87–5.11)
RDW: 13 % (ref 11.5–15.5)
WBC: 4.2 10*3/uL (ref 4.0–10.5)

## 2016-01-13 LAB — COMPREHENSIVE METABOLIC PANEL
ALBUMIN: 4.4 g/dL (ref 3.5–5.0)
ALK PHOS: 52 U/L (ref 38–126)
ALT: 28 U/L (ref 14–54)
AST: 45 U/L — AB (ref 15–41)
Anion gap: 7 (ref 5–15)
BUN: 8 mg/dL (ref 6–20)
CALCIUM: 9.2 mg/dL (ref 8.9–10.3)
CHLORIDE: 106 mmol/L (ref 101–111)
CO2: 28 mmol/L (ref 22–32)
CREATININE: 0.81 mg/dL (ref 0.44–1.00)
GFR calc non Af Amer: >60 mL/min (ref >60)
GLUCOSE: 86 mg/dL (ref 65–99)
Potassium: 3.7 mmol/L (ref 3.5–5.1)
Sodium: 141 mmol/L (ref 135–145)
Total Bilirubin: 0.6 mg/dL (ref 0.3–1.2)
Total Protein: 7.8 g/dL (ref 6.5–8.1)

## 2016-01-13 LAB — URINALYSIS, ROUTINE W REFLEX MICROSCOPIC
Bilirubin Urine: NEGATIVE
Glucose, UA: NEGATIVE mg/dL
Hgb urine dipstick: NEGATIVE
Ketones, ur: NEGATIVE mg/dL
NITRITE: NEGATIVE
Protein, ur: NEGATIVE mg/dL
SPECIFIC GRAVITY, URINE: 1.022 (ref 1.005–1.030)
pH: 5.5 (ref 5.0–8.0)

## 2016-01-13 LAB — URINE MICROSCOPIC-ADD ON

## 2016-01-13 LAB — PREGNANCY, URINE: Preg Test, Ur: NEGATIVE

## 2016-01-13 LAB — LIPASE, BLOOD: Lipase: 31 U/L (ref 11–51)

## 2016-01-13 MED ORDER — CEPHALEXIN 500 MG PO CAPS: 500.0000 mg | ORAL_CAPSULE | Freq: Three times a day (TID) | ORAL | Status: DC

## 2016-01-13 MED ORDER — SODIUM CHLORIDE 0.9 % IV BOLUS (SEPSIS)
1000.0000 mL | Freq: Once | INTRAVENOUS | Status: AC
  Administered 2016-01-13: 1000 mL via INTRAVENOUS

## 2016-01-13 MED ORDER — MORPHINE SULFATE (PF) 4 MG/ML IV SOLN
2.0000 mg | Freq: Once | INTRAVENOUS | Status: AC
  Administered 2016-01-13: 2 mg via INTRAVENOUS
  Filled 2016-01-13: qty 1

## 2016-01-13 MED FILL — CEPHALEXIN 500 MG CAPSULE: 500 | 10 days supply | Qty: 30 | Fill #0

## 2016-01-13 NOTE — ED Provider Notes (Signed)
CSN: [id number]     Arrival date & time 01/13/16  1213 History   First MD Initiated Contact with Patient 01/13/16 1250     Chief Complaint  Patient presents with  . Flank Pain    HPI   Autumn Stephens is a 25 y.o. female with a PMH of migraine, IBS, pyelonephritis who presents to the ED with left flank pain, which she states started yesterday and has been constant since that time. She reports movement exacerbates her pain. She has tried tylenol with no significant symptom relief. She reports she was diagnosed with the flu yesterday, however did not pick up her tamiflu rx due to cost. She reports generalized body aches as well as diarrhea. She denies fever, chills, abdominal pain, nausea, vomiting, dysuria, urgency, frequency, hematuria.   Past Medical History  Diagnosis Date  . H/O varicella   . Migraine, unspecified, without mention of intractable migraine without mention of status migrainosus   . Irritable bowel syndrome   . Diffuse cystic mastopathy   . Other malaise and fatigue   . Tachycardia   . NSVD (normal spontaneous vaginal delivery - 11/5) 09/28/2012  . Postpartum care following vaginal delivery 09/28/2012  . Perineal laceration, first degree 09/28/2012  . Maternal anemia complicating pregnancy, childbirth, or the puerperium 09/28/2012  . Rubella nonimmune status, delivered, current hospitalization 09/28/2012  . Pyelonephritis    Past Surgical History  Procedure Laterality Date  . No past surgeries     Family History  Problem Relation Age of Onset  . Cancer Mother     skin  . Rheum arthritis Father   . Diabetes Maternal Grandfather   . Cancer Maternal Grandfather     prostate   Social History  Substance Use Topics  . Smoking status: Never Smoker   . Smokeless tobacco: Never Used  . Alcohol Use: No   OB History    Gravida Para Term Preterm AB TAB SAB Ectopic Multiple Living   0 0 0 0 0 0 2      Review of Systems  Constitutional: Positive for fatigue.  Negative for fever and chills.  Gastrointestinal: Positive for diarrhea. Negative for nausea, vomiting, abdominal pain and constipation.  Genitourinary: Positive for flank pain. Negative for dysuria, urgency, frequency and hematuria.  Musculoskeletal: Positive for myalgias.  All other systems reviewed and are negative.     Allergies  Augmentin; Avelox; and Biaxin  Home Medications   Prior to Admission medications   Medication Sig Start Date End Date Taking? Authorizing Provider  HYDROcodone-acetaminophen (HYCET) 7.5-325 mg/15 ml solution Take 10 mLs by mouth 4 (four) times daily as needed for moderate pain.   Yes Historical Provider, MD  cephALEXin (KEFLEX) 500 MG capsule Take 1 capsule (500 mg total) by mouth 3 (three) times daily. 01/13/16   Mady Gemma, PA-C    BP 119/76 mmHg  Pulse 88  Temp(Src) 99.4 F (37.4 C) (Oral)  Resp 18  Ht  (1.778 m)  Wt 92.08 kg  BMI 29.13 kg/m2  SpO2 99%  LMP 12/13/2015 Physical Exam  Constitutional: She is oriented to person, place, and time. She appears well-developed and well-nourished. No distress.  HENT:  Head: Normocephalic and atraumatic.  Right Ear: External ear normal.  Left Ear: External ear normal.  Nose: Nose normal.  Mouth/Throat: Uvula is midline, oropharynx is clear and moist and mucous membranes are normal.  Eyes: Conjunctivae, EOM and lids are normal. Pupils are equal, round, and reactive to light. Right  eye exhibits no discharge. Left eye exhibits no discharge. No scleral icterus.  Neck: Normal range of motion. Neck supple.  Cardiovascular: Normal rate, regular rhythm, normal heart sounds, intact distal pulses and normal pulses.   Pulmonary/Chest: Effort normal and breath sounds normal. No respiratory distress. She has no wheezes. She has no rales.  Abdominal: Soft. Normal appearance and bowel sounds are normal. She exhibits no distension and no mass. There is no tenderness. There is no rigidity, no rebound and  no guarding.  Mild TTP to left inferior flank. No CVA tenderness.  Musculoskeletal: Normal range of motion. She exhibits no edema or tenderness.  Neurological: She is alert and oriented to person, place, and time.  Skin: Skin is warm, dry and intact. No rash noted. She is not diaphoretic. No erythema. No pallor.  Psychiatric: She has a normal mood and affect. Her speech is normal and behavior is normal.  Nursing note and vitals reviewed.   ED Course  Procedures (including critical care time)  Labs Review Labs Reviewed  URINALYSIS, ROUTINE W REFLEX MICROSCOPIC (NOT AT [facility]) - Abnormal; Notable for the following:    APPearance CLOUDY (*)    Leukocytes, UA SMALL (*)    All other components within normal limits  URINE MICROSCOPIC-ADD ON - Abnormal; Notable for the following:    Squamous Epithelial / LPF 6-30 (*)    Bacteria, UA MANY (*)    All other components within normal limits  COMPREHENSIVE METABOLIC PANEL - Abnormal; Notable for the following:    AST 45 (*)    All other components within normal limits  URINE CULTURE  PREGNANCY, URINE  CBC WITH DIFFERENTIAL/PLATELET  LIPASE, BLOOD    Imaging Review No results found.   I have personally reviewed and evaluated these lab results as part of my medical decision-making.   EKG Interpretation None      MDM   Final diagnoses:  Left flank pain    25 year old female presents with left flank pain. Reports she was diagnosed with the flu yesterday. States she has a history of pyelonephritis and her symptoms feel similar, though not as severe. Denies fever, chills, abdominal pain, nausea, vomiting, dysuria, urgency, frequency, hematuria. Patient's temp 100.5, tachycardic to 118. No hypotension. Abdomen soft, non-tender, non-distended. No rebound, guarding, or masses. Mild TTP to left inferior flank. No CVA tenderness. CBC negative for leukocytosis or anemia. CMP remarkable for AST 45. Lipase within normal limits. Urine pregnancy  negative. UA remarkable for small leukocytes, many bacteria, 6-30 WBC. Urine culture ordered. Discussed findings with patient. Patient is non-toxic and well-appearing, feel she is stable for discharge at this time. Patient's temp improved to 99.4, HR 88. Will treat with keflex. Patient to follow up with PCP. Return precautions discussed. Patient verbalizes her understanding and is in agreement with plan.  BP 119/76 mmHg  Pulse 88  Temp(Src) 99.4 F (37.4 C) (Oral)  Resp 18  Ht  (1.778 m)  Wt 92.08 kg  BMI 29.13 kg/m2  SpO2 99%  LMP 12/13/2015      Mady Gemma, PA-C 01/13/16 1510  Arby Barrette, MD 01/13/16 [phone]

## 2016-01-13 NOTE — Discharge Instructions (Signed)
1. Medications: keflex, usual home medications 2. Treatment: rest, drink plenty of fluids  3. Follow Up: please followup with your primary doctor in 2-3 days for discussion of your diagnoses and further evaluation after today's visit; if you do not have a primary care doctor use the resource guide provided to find one; please return to the ER for high fever, increased pain, new or worsening symptoms   Flank Pain Flank pain refers to pain that is located on the side of the body between the upper abdomen and the back. The pain may occur over a short period of time (acute) or may be long-term or reoccurring (chronic). It may be mild or severe. Flank pain can be caused by many things. CAUSES  Some of the more common causes of flank pain include:  Muscle strains.   Muscle spasms.   A disease of your spine (vertebral disk disease).   A lung infection (pneumonia).   Fluid around your lungs (pulmonary edema).   A kidney infection.   Kidney stones.   A very painful skin rash caused by the chickenpox virus (shingles).   Gallbladder disease.  HOME CARE INSTRUCTIONS  Home care will depend on the cause of your pain. In general,  Rest as directed by your caregiver.  Drink enough fluids to keep your urine clear or pale yellow.  Only take over-the-counter or prescription medicines as directed by your caregiver. Some medicines may help relieve the pain.  Tell your caregiver about any changes in your pain.  Follow up with your caregiver as directed. SEEK IMMEDIATE MEDICAL CARE IF:   Your pain is not controlled with medicine.   You have new or worsening symptoms.  Your pain increases.   You have abdominal pain.   You have shortness of breath.   You have persistent nausea or vomiting.   You have swelling in your abdomen.   You feel faint or pass out.   You have blood in your urine.  You have a fever or persistent symptoms for more than 2-3 days.  You have a  fever and your symptoms suddenly get worse. MAKE SURE YOU:   Understand these instructions.  Will watch your condition.  Will get help right away if you are not doing well or get worse.   This information is not intended to replace advice given to you by your health care provider. Make sure you discuss any questions you have with your health care provider.   Document Released: 12/31/2005 Document Revised: 08/03/2012 Document Reviewed: 06/23/2012 Elsevier Interactive Patient Education 2016 [employer].   Emergency Department Resource Guide 1) Find a Doctor and Pay Out of Pocket Although you won't have to find out who is covered by your insurance plan, it is a good idea to ask around and get recommendations. You will then need to call the office and see if the doctor you have chosen will accept you as a new patient and what types of options they offer for patients who are self-pay. Some doctors offer discounts or will set up payment plans for their patients who do not have insurance, but you will need to ask so you aren't surprised when you get to your appointment.  2) Contact Your Local Health Department Not all health departments have doctors that can see patients for sick visits, but many do, so it is worth a call to see if yours does. If you don't know where your local health department is, you can check in your phone book.  The CDC also has a tool to help you locate your state's health department, and many state websites also have listings of all of their local health departments.  3) Find a Walk-in Clinic If your illness is not likely to be very severe or complicated, you may want to try a walk in clinic. These are popping up all over the country in pharmacies, drugstores, and shopping centers. They're usually staffed by nurse practitioners or physician assistants that have been trained to treat common illnesses and complaints. They're usually fairly quick and inexpensive. However, if you  have serious medical issues or chronic medical problems, these are probably not your best option.  No Primary Care Doctor: - Call Health Connect at  [phone] - they can help you locate a primary care doctor that  accepts your insurance, provides certain services, etc. - Physician Referral Service- [phone]  Chronic Pain Problems: Organization         Address  Phone   Notes  [facility]  [phone] Patients need to be referred by their primary care doctor.   Medication Assistance: Organization         Address  Phone   Notes  [facility] Medication [facility] [address] [place] [place], [place] [address] [phone] --Must be a resident of [facility] -- Must have NO insurance coverage whatsoever (no Medicaid/ Medicare, etc.) -- The pt. MUST have a primary care doctor that directs their care regularly and follows them in the community   MedAssist  [phone]   [employer]  [phone]    Agencies that provide inexpensive medical care: Organization         Address  Phone   Notes  [facility]  [phone]   [facility]    [phone]   [facility] [address] [place], [place] [address] [phone]   [facility] [address] [place] [phone]   Planned Parenthood    [phone]   [facility]    [phone]   [facility]  [address], [place] Phone:  [phone], Fax:  [phone] Hours of Operation:  9 am - 6 pm, M-F.  Also accepts Medicaid/Medicare and self-pay.  [facility]  [address], [place] Phone: [phone], Fax: [phone]. Hours of Operation:  8:30 am - 5:30 pm, M-F.  Also accepts Medicaid and self-pay.  [facility] [place] [address], [place] Phone: [phone]   [facility] [address]  Natasha Bence [place], [place] [phone], Ext. [phone] Mondays & Thursdays: 7-9 AM.  First 15 patients are seen on a first come, first serve basis.    Medicaid-accepting [facility] Providers:  Organization         Address  Phone   Notes  [facility] [address], [place] [phone] Also accepts self-pay patients.  [facility] [address] Laurell Josephs [place], [place]  [phone]   [facility] [address], [place] [phone]   [facility] [address], [place] [phone]   Renaye Rakers [address], [place]   [phone] Only accepts [place] Access [place] patients after they have their name applied to their card.   Self-Pay (  no insurance) in [facility]:  Organization         Address  Phone   Notes  Sickle Cell Patients, [facility] Internal Medicine [address] [place], [place] [phone]   [facility]gent Care [address] [place], [place] [phone]   [facility]  [address], [place] [phone]   [facility]/Dr. Osei-Bonsu  [address] [place] or [address], [place] [phone] Phone number for both [place] and [place] locations is the same.  [facility] and [facility] [address] [place] [phone]   [facility] [address] [place] or [address] [phone]   [facility] [address] [place] [phone], phone; [phone], fax Sees patients 1st and 3rd Saturday of every month.  Must not qualify for public or private insurance (i.e. Medicaid, Medicare, [place] Health Choice, Veterans' Benefits)  Household income should be no more than 200% of the poverty level The clinic cannot treat you if you are pregnant or think you are pregnant   Sexually transmitted diseases are not treated at the clinic.    Dental Care: Organization         Address  Phone  Notes  [facility] Department of [facility] [address] [place], [place] [phone] Accepts children up to age 24 who are enrolled in [place] or [place] Health Choice; pregnant women with a Medicaid card; and children who have applied for Medicaid or [place] Health Choice, but were declined, whose parents can pay a reduced fee at time of service.  [facility]  [address], [place] [phone] Accepts children up to age 29 who are enrolled in [place] or [place] Health Choice; pregnant women with a Medicaid card; and children who have applied for Medicaid or [place] Health Choice, but were declined, whose parents can pay a reduced fee at time of service.  [facility]  [address] [place], [place] [phone] Patients are seen by appointment only. Walk-ins are not accepted. [facility] will see patients 70 years of age and older. Monday - Tuesday (8am-5pm) Most Wednesdays (8:30-5pm) $30 per visit, cash only  [facility]  [address], [facility] [phone] Patients are seen by appointment only. Walk-ins are not accepted. [facility] will see patients 10 years of age and older. One Wednesday Evening (Monthly: Volunteer Based).  $30 per visit, cash only  [employer]  [phone] for adults; Children under age 36, call Graduate Pediatric Dentistry at [phone]. Children aged 15-14, please call [phone] to request a pediatric application.  Dental services are provided in all areas of dental care including fillings, crowns and bridges, complete and partial dentures, implants, gum treatment, root canals, and extractions. Preventive care is also provided. Treatment is provided to both adults and children. Patients  are selected via a lottery and there is often a waiting list.   [facility] [address] [place]  [phone] [email or web address]   [facility] [address] [place], [place] [phone], Ext. [phone] Second and Fourth Thursday of each month, opens at 6:30 AM; Clinic ends at 9 AM.  Patients are seen on a first-come first-served basis, and a limited number are  seen during each clinic.   [facility]  [address] Ether Griffins [place], [place] [phone]   Eligibility Requirements You must have lived in [place], [place], or [place] counties for at least the last three months.   You cannot be eligible for state or federal sponsored [place], including [employer], [place], or [employer].   You generally cannot be eligible for healthcare insurance through your employer.    How to apply: Eligibility screenings are held every Tuesday and Wednesday afternoon from 1:00 pm until 4:00 pm. You do not need an appointment for the interview!  [facility] [address] [place], [place] [phone]   [facility] Health Department  [phone]   [facility] Health Department  [phone]   [facility] Health Department  [phone]    Behavioral Health Resources in the Community: Intensive Outpatient Programs Organization         Address  Phone  Notes  [facility] Services [address] [place], [place] [phone]   [facility] Outpatient [address] [place], [place] [phone]   [facility] [address], [place], [place]  [phone]   [facility] Mental Health [address],  [place], [place] [phone] or [phone]   Substance Abuse Resources Organization         Address  Phone  Notes  [facility]  [phone]   [facility]  [phone]   [facility]  [phone]   Floydene Flock  [phone]    Residential & Outpatient Substance Abuse Program  [phone]   Psychological Services Organization         Address  Phone  Notes  [facility] Behavioral Health  [phone]   [facility]  [phone]   [facility] Mental Health [address], [place] [phone] or [phone]    Mobile Crisis Teams Organization         Address  Phone  Notes  [facility], Mobile Crisis Care Unit  [phone]   [facility]  [address] [place], [place] [phone]   Doristine Locks [address] [place] [phone]    Self-Help/Support Groups Organization         Address  Phone             Notes  [facility] - variety of support groups  336- I7437963 Call for more information  Narcotics Anonymous (NA), [facility] [address], [employer] [place]  2 meetings at this location   Statistician         Address  Phone  Notes  [facility] [address],    [place]  [phone]   [facility]  [address] [place] [address], [place], [place] [phone]   [facility] [address] [place], [place] [phone] Admissions: 8am-3pm M-F  [facility] [address],    [place], [place] [phone]   [facility] [address] Starling Manns [place], [place] [phone]   [facility] [address]  [place], [place] [phone]   [facility] - Intensive Outpatient [address] Laurell Josephs [address], [place], [place] [phone]   [facility] (Addiction Recovery [facility] [address] [place]  [place], [place] [phone] or [phone]   [facility] ([facility]) [address] [place], [place] [phone] Accepts Medicaid  [facility] [address]  [place] [phone] Substance Abuse/Addiction Treatment   [facility] Organization         Address  Phone  Notes  [facility]  [phone]   Angie Fava, PhD [address] Ervin Knack [place], [place]   [phone] or [phone]   [facility] Behavioral   [address] [place], [place] [phone]   [facility] Recovery [address], [place], [place] [phone] Insurance/Medicaid/sponsorship through [facility] and Families [address]                                    [place], [place] [phone] Therapy/tele-psych/case  [facility] [address], [place] [phone]    Dr. Lolly Mustache  [phone]   [facility]  [employer] [facility] Dept. 1) [address] [place] 2) [address], [place] 3)  [address], [place] [phone]  [phone]   [facility] Child Abuse Hotline (670)277-1236 or [phone] (After Hours)

## 2016-01-13 NOTE — ED Notes (Signed)
C/o bilat flank pain, diarrhea started yesterday-dx with flu yesterday-pt did not get flu shot-NAD

## 2016-01-15 LAB — URINE CULTURE

## 2016-06-02 ENCOUNTER — Emergency Department (HOSPITAL_BASED_OUTPATIENT_CLINIC_OR_DEPARTMENT_OTHER)
Admission: EM | Admit: 2016-06-02 | Discharge: 2016-06-02 | Disposition: A | Discharge status: Home / Self Care | Payer: 59 | Attending: Emergency Medicine | Admitting: Emergency Medicine

## 2016-06-02 ENCOUNTER — Encounter (HOSPITAL_BASED_OUTPATIENT_CLINIC_OR_DEPARTMENT_OTHER): Payer: Self-pay | Admitting: *Deleted

## 2016-06-02 DIAGNOSIS — M545 Low back pain, unspecified: Secondary | ICD-10-CM

## 2016-06-02 MED ORDER — PREDNISONE 50 MG PO TABS
60.0000 mg | ORAL_TABLET | Freq: Once | ORAL | Status: AC
  Administered 2016-06-02: 60 mg via ORAL
  Filled 2016-06-02: qty 1

## 2016-06-02 MED ORDER — CYCLOBENZAPRINE HCL 10 MG PO TABS: 10.0000 mg | ORAL_TABLET | Freq: Two times a day (BID) | ORAL | Status: DC | PRN

## 2016-06-02 MED ORDER — PREDNISONE 10 MG (21) PO TBPK: 10.0000 mg | ORAL_TABLET | Freq: Every day | ORAL | Status: DC

## 2016-06-02 MED FILL — predniSONE 10 MG TABS: 10 | 12 days supply | Qty: 42 | Fill #0

## 2016-06-02 MED FILL — CYCLOBENZAPRINE 10 MG TAB: 10 | 10 days supply | Qty: 20 | Fill #0

## 2016-06-02 NOTE — ED Provider Notes (Signed)
CSN: [id number]     Arrival date & time 06/02/16  0845 History   First MD Initiated Contact with Patient 06/02/16 [phone]     Chief Complaint  Patient presents with  . Back Pain     (Consider location/radiation/quality/duration/timing/severity/associated sxs/prior Treatment) HPI   Patient is a [age] female with a history of migraines who presents the ED with lower back pain for 2 days. Patient states she was bending over to pick up a hose and felt a sharp pain in her lower back. She describes the pain as spasms/tightness, worse with movements, 7/10, nonradiating, worse with movement and rising from a lying or seated position. Patient has tried ice, Tylenol, Biofreeze with little relief. She denies IV drug use, fever, chills, abdominal pain, saddle anesthesia, loss of bowel or bladder function, recent travel.  Past Medical History  Diagnosis Date  . H/O varicella   . Migraine, unspecified, without mention of intractable migraine without mention of status migrainosus   . Irritable bowel syndrome   . Diffuse cystic mastopathy   . Other malaise and fatigue   . Tachycardia   . NSVD (normal spontaneous vaginal delivery - 11/5) 09/28/2012  . Postpartum care following vaginal delivery 09/28/2012  . Perineal laceration, first degree 09/28/2012  . Maternal anemia complicating pregnancy, childbirth, or the puerperium 09/28/2012  . Rubella nonimmune status, delivered, current hospitalization 09/28/2012  . Pyelonephritis    Past Surgical History  Procedure Laterality Date  . No past surgeries     Family History  Problem Relation Age of Onset  . Cancer Mother     skin  . Rheum arthritis Father   . Diabetes Maternal Grandfather   . Cancer Maternal Grandfather     prostate   Social History  Substance Use Topics  . Smoking status: Never Smoker   . Smokeless tobacco: Never Used  . Alcohol Use: No   OB History    Gravida Para Term Preterm AB TAB SAB Ectopic Multiple Living   2 2 2  0 0 0 0 0 0 2      Review of Systems  Constitutional: Negative for fever and chills.  Gastrointestinal: Negative for abdominal pain.  Musculoskeletal: Positive for back pain. Negative for gait problem, neck pain and neck stiffness.  Skin: Negative for rash.  Neurological: Negative for weakness, numbness and headaches.      Allergies  Augmentin; Avelox; and Biaxin  Home Medications   Prior to Admission medications   Medication Sig Start Date End Date Taking? Authorizing Provider  cephALEXin (KEFLEX) 500 MG capsule Take 1 capsule (500 mg total) by mouth 3 (three) times daily. 01/13/16   Mady GemmaElizabeth C Westfall, PA-C  cyclobenzaprine (FLEXERIL) 10 MG tablet Take 1 tablet (10 mg total) by mouth 2 (two) times daily as needed for muscle spasms. 06/02/16   Jerre SimonJessica L Samaad Hashem, PA  HYDROcodone-acetaminophen (HYCET) 7.5-325 mg/15 ml solution Take 10 mLs by mouth 4 (four) times daily as needed for moderate pain.    Historical Provider, MD  predniSONE (STERAPRED UNI-PAK 21 TAB) 10 MG (21) TBPK tablet Take 1 tablet (10 mg total) by mouth daily. Take 6 tabs by mouth daily  for 2 days, then 5 tabs for 2 days, then 4 tabs for 2 days, then 3 tabs for 2 days, 2 tabs for 2 days, then 1 tab by mouth daily for 2 days 06/02/16   Shanda BumpsJessica L Lella Mullany, PA   BP 137/92 mmHg  Pulse 94  Temp(Src) 98.3 F (36.8 C) (Oral)  Resp 18  Ht  (1.778 m)  Wt 90.719 kg  BMI 28.70 kg/m2  SpO2 100%  LMP 05/18/2016  Breastfeeding? No Physical Exam  Constitutional: She appears well-developed and well-nourished. No distress.  HENT:  Head: Normocephalic and atraumatic.  Eyes: Conjunctivae are normal.  Neck: Normal range of motion.  Cardiovascular:  Pulses:      Dorsalis pedis pulses are 2+ on the right side, and 2+ on the left side.  Pulmonary/Chest: Effort normal.  Musculoskeletal: She exhibits no edema.  Examination of the thoracic and lumbar spine revealed no step-offs or deformities, full AROM of T and L-spine, no tenderness to  palpation of cervical, thoracic or lumbar spine, mild TTP to right-sided paraspinal muscles and right SI joint, strength 5/5 of bilateral lower extremities including plantar flexion and extension, full AROM of bilateral lower extremities, pain elicited of right lower back with straight leg raise bilaterally, sensation intact to light touch in bilateral lower extremities, patient neurovascular intact distally.  Neurological: She is alert. No sensory deficit. Coordination and gait normal.  Normal gait  Skin: Skin is warm and dry. No rash noted. She is not diaphoretic.  Psychiatric: She has a normal mood and affect. Her behavior is normal.  Nursing note and vitals reviewed.     ED Course  Procedures (including critical care time) Labs Review Labs Reviewed - No data to display  Imaging Review No results found. I have personally reviewed and evaluated these images and lab results as part of my medical decision-making.   EKG Interpretation None      MDM   Final diagnoses:  Right-sided low back pain without sciatica   Patient with back pain.  No neurological deficits and normal neuro exam.  Patient can walk but states it is painful.  No loss of bowel or bladder control.  No concern for cauda equina.  No fever, night sweats, weight loss, h/o cancer, IVDU.  RICE protocol, steroids and pain medicine indicated and discussed with patient.   Patient blood pressure slightly elevated upon arrival to the ED. Instructed patient to follow-up with her primary care provider within 2-3 days to have her back pain and reevaluated and discuss her elevated blood pressure. Discussed strict return precautions. Patient was understanding to the discharge instructions.     Jerre Simon, PA 06/02/16 1610  Alvira Monday, MD 06/03/16 1341

## 2016-06-02 NOTE — Discharge Instructions (Signed)
Follow-up with your primary care provider within 2-3 days to have your back pain revaluated and to discuss your elevated blood pressure. Take the prednisone dosepak as prescribed. Take Flexeril at night. It can cause drowsiness so be sure not to drive or operate machinery while on this medication. Avoid alcohol while taking Flexeril. Continue to ice your back and take Tylenol as needed for pain.  Return to the emergency department if you experience numbness of your inner thighs, loss of bowel or bladder function, worsening pain, numbness or tingling or weakness in your legs, fever, abdominal pain, nausea and vomiting.  Back Pain, Adult Back pain is very common in adults.The cause of back pain is rarely dangerous and the pain often gets better over time.The cause of your back pain may not be known. Some common causes of back pain include:  Strain of the muscles or ligaments supporting the spine.  Wear and tear (degeneration) of the spinal disks.  Arthritis.  Direct injury to the back. For many people, back pain may return. Since back pain is rarely dangerous, most people can learn to manage this condition on their own. HOME CARE INSTRUCTIONS Watch your back pain for any changes. The following actions may help to lessen any discomfort you are feeling:  Remain active. It is stressful on your back to sit or stand in one place for long periods of time. Do not sit, drive, or stand in one place for more than 30 minutes at a time. Take short walks on even surfaces as soon as you are able.Try to increase the length of time you walk each day.  Exercise regularly as directed by your health care provider. Exercise helps your back heal faster. It also helps avoid future injury by keeping your muscles strong and flexible.  Do not stay in bed.Resting more than 1-2 days can delay your recovery.  Pay attention to your body when you bend and lift. The most comfortable positions are those that put less  stress on your recovering back. Always use proper lifting techniques, including:  Bending your knees.  Keeping the load close to your body.  Avoiding twisting.  Find a comfortable position to sleep. Use a firm mattress and lie on your side with your knees slightly bent. If you lie on your back, put a pillow under your knees.  Avoid feeling anxious or stressed.Stress increases muscle tension and can worsen back pain.It is important to recognize when you are anxious or stressed and learn ways to manage it, such as with exercise.  Take medicines only as directed by your health care provider. Over-the-counter medicines to reduce pain and inflammation are often the most helpful.Your health care provider may prescribe muscle relaxant drugs.These medicines help dull your pain so you can more quickly return to your normal activities and healthy exercise.  Apply ice to the injured area:  Put ice in a plastic bag.  Place a towel between your skin and the bag.  Leave the ice on for 20 minutes, 2-3 times a day for the first 2-3 days. After that, ice and heat may be alternated to reduce pain and spasms.  Maintain a healthy weight. Excess weight puts extra stress on your back and makes it difficult to maintain good posture. SEEK MEDICAL CARE IF:  You have pain that is not relieved with rest or medicine.  You have increasing pain going down into the legs or buttocks.  You have pain that does not improve in one week.  You have  night pain.  You lose weight.  You have a fever or chills. SEEK IMMEDIATE MEDICAL CARE IF:   You develop new bowel or bladder control problems.  You have unusual weakness or numbness in your arms or legs.  You develop nausea or vomiting.  You develop abdominal pain.  You feel faint.   This information is not intended to replace advice given to you by your health care provider. Make sure you discuss any questions you have with your health care provider.     Document Released: 11/09/2005 Document Revised: 11/30/2014 Document Reviewed: 03/13/2014 Elsevier Interactive Patient Education [employer]

## 2016-06-02 NOTE — ED Notes (Signed)
Pt reports lower back pain since bending over to pick up a vacuum cleaner on Sunday. Denies fall. Reports pain "spasms" with certain movements

## 2016-08-18 LAB — OB RESULTS CONSOLE HEPATITIS B SURFACE ANTIGEN: HEP B S AG: NEGATIVE

## 2016-08-18 LAB — OB RESULTS CONSOLE ANTIBODY SCREEN: Antibody Screen: NEGATIVE

## 2016-08-18 LAB — OB RESULTS CONSOLE RUBELLA ANTIBODY, IGM: Rubella: IMMUNE

## 2016-08-18 LAB — OB RESULTS CONSOLE RPR: RPR: NONREACTIVE

## 2016-08-18 LAB — OB RESULTS CONSOLE ABO/RH: RH TYPE: POSITIVE

## 2016-08-18 LAB — OB RESULTS CONSOLE HIV ANTIBODY (ROUTINE TESTING): HIV: NONREACTIVE

## 2016-09-13 ENCOUNTER — Encounter (HOSPITAL_COMMUNITY): Payer: Self-pay | Admitting: *Deleted

## 2016-09-13 ENCOUNTER — Inpatient Hospital Stay (HOSPITAL_COMMUNITY): Payer: 59

## 2016-09-13 ENCOUNTER — Inpatient Hospital Stay (HOSPITAL_COMMUNITY)
Admission: AD | Admit: 2016-09-13 | Discharge: 2016-09-13 | Disposition: A | Discharge status: Home / Self Care | Payer: 59 | Source: Ambulatory Visit | Attending: Obstetrics and Gynecology | Admitting: Obstetrics and Gynecology

## 2016-09-13 DIAGNOSIS — O4692 Antepartum hemorrhage, unspecified, second trimester: Secondary | ICD-10-CM | POA: Diagnosis not present

## 2016-09-13 DIAGNOSIS — O4690 Antepartum hemorrhage, unspecified, unspecified trimester: Secondary | ICD-10-CM

## 2016-09-13 DIAGNOSIS — O468X2 Other antepartum hemorrhage, second trimester: Secondary | ICD-10-CM

## 2016-09-13 DIAGNOSIS — Z880 Allergy status to penicillin: Secondary | ICD-10-CM | POA: Diagnosis not present

## 2016-09-13 DIAGNOSIS — Z3A13 13 weeks gestation of pregnancy: Secondary | ICD-10-CM | POA: Diagnosis not present

## 2016-09-13 DIAGNOSIS — Z6790 Unspecified blood type, Rh positive: Secondary | ICD-10-CM | POA: Insufficient documentation

## 2016-09-13 DIAGNOSIS — O418X20 Other specified disorders of amniotic fluid and membranes, second trimester, not applicable or unspecified: Secondary | ICD-10-CM

## 2016-09-13 DIAGNOSIS — O209 Hemorrhage in early pregnancy, unspecified: Secondary | ICD-10-CM | POA: Diagnosis present

## 2016-09-13 LAB — URINALYSIS, ROUTINE W REFLEX MICROSCOPIC
Bilirubin Urine: NEGATIVE
GLUCOSE, UA: NEGATIVE mg/dL
Ketones, ur: NEGATIVE mg/dL
LEUKOCYTES UA: NEGATIVE
Nitrite: NEGATIVE
PH: 6 (ref 5.0–8.0)
Protein, ur: NEGATIVE mg/dL
SPECIFIC GRAVITY, URINE: 1.025 (ref 1.005–1.030)

## 2016-09-13 LAB — URINE MICROSCOPIC-ADD ON

## 2016-09-13 NOTE — MAU Note (Addendum)
Pt states she had some "cloudy" discharge yesterday morning, went to the bathroom this morning & saw bright red blood with wiping.  Also saw blood when she went to the restroom in MAU, but not as much.  Slight cramping.

## 2016-09-13 NOTE — Discharge Instructions (Signed)
Subchorionic Hematoma °A subchorionic hematoma is a gathering of blood between the outer wall of the placenta and the inner wall of the womb (uterus). The placenta is the organ that connects the fetus to the wall of the uterus. The placenta performs the feeding, breathing (oxygen to the fetus), and waste removal (excretory work) of the fetus.  °Subchorionic hematoma is the most common abnormality found on a result from ultrasonography done during the first trimester or early second trimester of pregnancy. If there has been little or no vaginal bleeding, early small hematomas usually shrink on their own and do not affect your baby or pregnancy. The blood is gradually absorbed over 1-2 weeks. When bleeding starts later in pregnancy or the hematoma is larger or occurs in an older pregnant woman, the outcome may not be as good. Larger hematomas may get bigger, which increases the chances for miscarriage. Subchorionic hematoma also increases the risk of premature detachment of the placenta from the uterus, preterm (premature) labor, and stillbirth. °HOME CARE INSTRUCTIONS °· Stay on bed rest if your health care provider recommends this. Although bed rest will not prevent more bleeding or prevent a miscarriage, your health care provider may recommend bed rest until you are advised otherwise. °· Avoid heavy lifting (more than 10 lb [4.5 kg]), exercise, sexual intercourse, or douching as directed by your health care provider. °· Keep track of the number of pads you use each day and how soaked (saturated) they are. Write down this information. °· Do not use tampons. °· Keep all follow-up appointments as directed by your health care provider. Your health care provider may ask you to have follow-up blood tests or ultrasound tests or both. °SEEK IMMEDIATE MEDICAL CARE IF: °· You have severe cramps in your stomach, back, abdomen, or pelvis. °· You have a fever. °· You pass large clots or tissue. Save any tissue for your health  care provider to look at. °· Your bleeding increases or you become lightheaded, feel weak, or have fainting episodes. °  °This information is not intended to replace advice given to you by your health care provider. Make sure you discuss any questions you have with your health care provider. °  °Document Released: 02/24/2007 Document Revised: 11/30/2014 Document Reviewed: 06/08/2013 °Elsevier Interactive Patient Education ©2016 Elsevier Inc. ° °

## 2016-09-13 NOTE — MAU Provider Note (Signed)
History     CSN: [id number]  Arrival date and time: 09/13/16 [id number]   First Provider Initiated Contact with Patient 09/13/16 [phone]      Chief Complaint  Patient presents with  . Vaginal Bleeding   G3P2002 @13 .5 weeks here with VB this am around 0630. She reports as bright red blood on the toilet paper. She reports light brown vaginal discharge yesterday. No malodor or vaginal itching. No urinary sx. No recent IC. No recent falls or strenuous activity. She also reports mild lower abd cramping since arrival.     OB History    Gravida Para Term Preterm AB Living   3 2 2  0 0 2   SAB TAB Ectopic Multiple Live Births   0 0 0 0 2      Past Medical History:  Diagnosis Date  . Diffuse cystic mastopathy   . H/O varicella   . Irritable bowel syndrome   . Maternal anemia complicating pregnancy, childbirth, or the puerperium 09/28/2012  . Migraine, unspecified, without mention of intractable migraine without mention of status migrainosus   . NSVD (normal spontaneous vaginal delivery - 11/5) 09/28/2012  . Other malaise and fatigue   . Perineal laceration, first degree 09/28/2012  . Postpartum care following vaginal delivery 09/28/2012  . Pyelonephritis   . Rubella nonimmune status, delivered, current hospitalization 09/28/2012  . Tachycardia     Past Surgical History:  Procedure Laterality Date  . NO PAST SURGERIES      Family History  Problem Relation Age of Onset  . Cancer Mother     skin  . Rheum arthritis Father   . Diabetes Maternal Grandfather   . Cancer Maternal Grandfather     prostate    Social History  Substance Use Topics  . Smoking status: Never Smoker  . Smokeless tobacco: Never Used  . Alcohol use No    Allergies:  Allergies  Allergen Reactions  . Augmentin [Amoxicillin-Pot Clavulanate] Nausea And Vomiting  . Avelox [Moxifloxacin Hcl In Nacl] Nausea And Vomiting  . Biaxin [Clarithromycin] Nausea And Vomiting      Review of Systems  Constitutional:  Negative.   Gastrointestinal: Positive for abdominal pain.  Genitourinary: Negative.    Physical Exam   Blood pressure 133/77, pulse 96, temperature 97.8 F (36.6 C), temperature source Oral, resp. rate 18, height 5\' 10"  (1.778 m), weight 98 kg (216 lb), last menstrual period 06/19/2016.  Physical Exam  Constitutional: She is oriented to person, place, and time. She appears well-developed and well-nourished. No distress.  HENT:  Head: Normocephalic and atraumatic.  Neck: Normal range of motion.  Cardiovascular: Normal rate.   Respiratory: Effort normal.  GI: Soft. She exhibits no distension. There is no tenderness.  Genitourinary:  Genitourinary Comments: External: no lesions Vagina: rugated, parous, scant drk bloody discharge SVE: closed/thick   Musculoskeletal: Normal range of motion.  Neurological: She is alert and oriented to person, place, and time.  Skin: Skin is warm and dry.  Psychiatric: She has a normal mood and affect.  FHT: 159 bpm Results for orders placed or performed during the hospital encounter of 09/13/16 (from the past 24 hour(s))  Urinalysis, Routine w reflex microscopic (not at [facility])     Status: Abnormal   Collection Time: 09/13/16  7:27 AM  Result Value Ref Range   Color, Urine YELLOW YELLOW   APPearance CLEAR CLEAR   Specific Gravity, Urine 1.025 1.005 - 1.030   pH 6.0 5.0 - 8.0   Glucose, UA NEGATIVE NEGATIVE  mg/dL   Hgb urine dipstick LARGE (A) NEGATIVE   Bilirubin Urine NEGATIVE NEGATIVE   Ketones, ur NEGATIVE NEGATIVE mg/dL   Protein, ur NEGATIVE NEGATIVE mg/dL   Nitrite NEGATIVE NEGATIVE   Leukocytes, UA NEGATIVE NEGATIVE  Urine microscopic-add on     Status: Abnormal   Collection Time: 09/13/16  7:27 AM  Result Value Ref Range   Squamous Epithelial / LPF 6-30 (A) NONE SEEN   WBC, UA 0-5 0 - 5 WBC/hpf   RBC / HPF 0-5 0 - 5 RBC/hpf   Bacteria, UA RARE (A) NONE SEEN   Urine-Other MUCOUS PRESENT    US Ob Comp Less 14 Wks  Result Date:  09/13/2016 CLINICAL DATA:  25 year old pregnant female patient with vaginal bleeding since 06:30 in the morning. EXAM: OBSTETRIC <14 WK ULTRASOUND TECHNIQUE: Transabdominal ultrasound was performed for evaluation of the gestation as well as the maternal uterus and adnexal regions. COMPARISON:  No priors. FINDINGS: Intrauterine gestational sac: Single Yolk sac:  None Embryo:  Present Cardiac Activity: Present Heart Rate: 167 bpm CRL:   73  mm   13 w 3 d                  [place] EDC: 03/18/2017 Subchorionic hemorrhage: There are 2 crescentic hypoechoic areas around the gestational sac measuring 4.2 x 1.2 x 0.9 cm laterally, and 3.2 x 2.0 x 2.7 cm inferiorly, compatible with small subchorionic hemorrhages. Maternal uterus/adnexae: Bilateral ovaries are normal in appearance. No significant volume of free fluid in the cul-de-sac. IMPRESSION: 1. Single viable IUP with estimated gestational age of [redacted] weeks and 3 days, and normal fetal heart rate of 167 beats per minute. 2. Two small subchorionic hemorrhages, as above. Electronically Signed   By: Trudie Reed M.D.   On: 09/13/2016 09:27    MAU Course  Procedures  MDM Labs and [place] ordered and reviewed. Presentation, clinical findings, [place], and plan reviewed with Dr. Billy Coast. Stable for discharge home.  Assessment and Plan   1. Vaginal bleeding in pregnancy, second trimester   2. Vaginal bleeding in pregnancy   3. Rh(D) positive   4. Subchorionic hemorrhage of placenta in second trimester, single or unspecified fetus    Discharge home Pelvic rest Follow up as scheduled in office Bleeding/SAB precautions   Donette Larry, CNM 09/13/2016, 8:12 AM

## 2016-10-13 ENCOUNTER — Inpatient Hospital Stay (HOSPITAL_COMMUNITY)
Admission: AD | Admit: 2016-10-13 | Discharge: 2016-10-13 | Disposition: A | Discharge status: Home / Self Care | Payer: 59 | Source: Ambulatory Visit | Attending: Obstetrics and Gynecology | Admitting: Obstetrics and Gynecology

## 2016-10-13 ENCOUNTER — Encounter (HOSPITAL_COMMUNITY): Payer: Self-pay | Admitting: *Deleted

## 2016-10-13 DIAGNOSIS — Z880 Allergy status to penicillin: Secondary | ICD-10-CM | POA: Insufficient documentation

## 2016-10-13 DIAGNOSIS — Z3A18 18 weeks gestation of pregnancy: Secondary | ICD-10-CM | POA: Diagnosis not present

## 2016-10-13 DIAGNOSIS — N949 Unspecified condition associated with female genital organs and menstrual cycle: Secondary | ICD-10-CM

## 2016-10-13 DIAGNOSIS — O26892 Other specified pregnancy related conditions, second trimester: Secondary | ICD-10-CM | POA: Insufficient documentation

## 2016-10-13 DIAGNOSIS — R102 Pelvic and perineal pain: Secondary | ICD-10-CM | POA: Diagnosis present

## 2016-10-13 LAB — URINALYSIS, ROUTINE W REFLEX MICROSCOPIC
Bilirubin Urine: NEGATIVE
GLUCOSE, UA: NEGATIVE mg/dL
HGB URINE DIPSTICK: NEGATIVE
Ketones, ur: NEGATIVE mg/dL
Nitrite: NEGATIVE
PH: 5.5 (ref 5.0–8.0)
Protein, ur: NEGATIVE mg/dL
Specific Gravity, Urine: 1.02 (ref 1.005–1.030)

## 2016-10-13 LAB — URINE MICROSCOPIC-ADD ON

## 2016-10-13 NOTE — Discharge Instructions (Signed)

## 2016-10-13 NOTE — MAU Note (Signed)
PT  SAYS SHE STARTED  CRAMPING  AT 730PM  - HAS  BECOME  MORE  TO LEFT SIDE.        HAS HAD  SUBCH     HEMOR  ON U/S.    IN OFFICE  ON Friday-   WBC IN URINE. Marland Kitchen.    LAST SEX-   WEEKS AGO.     NO MEDS  FOR  PAIN.  DID  NOT  CALL DR  Billy CoastAAVON

## 2016-10-13 NOTE — MAU Provider Note (Signed)
History     CSN: [id number]  Arrival date and time: 10/13/16 [id number]   First Provider Initiated Contact with Patient 10/13/16 0245      Chief Complaint  Patient presents with  . Pelvic Pain   Pelvic Pain  The patient's primary symptoms include pelvic pain. This is a new problem. The current episode started yesterday. The problem occurs constantly. The problem has been unchanged. Pain severity now: 4/10. The problem affects both sides. She is pregnant. Associated symptoms include abdominal pain. Pertinent negatives include no chills, constipation, diarrhea, dysuria, fever, frequency, nausea, urgency or vomiting. The vaginal discharge was normal. There has been no bleeding. The symptoms are aggravated by heavy lifting and activity. She has tried nothing for the symptoms.     Past Medical History:  Diagnosis Date  . Diffuse cystic mastopathy   . H/O varicella   . Irritable bowel syndrome   . Maternal anemia complicating pregnancy, childbirth, or the puerperium 09/28/2012  . Migraine, unspecified, without mention of intractable migraine without mention of status migrainosus   . NSVD (normal spontaneous vaginal delivery - 11/5) 09/28/2012  . Other malaise and fatigue   . Perineal laceration, first degree 09/28/2012  . Postpartum care following vaginal delivery 09/28/2012  . Pyelonephritis   . Rubella nonimmune status, delivered, current hospitalization 09/28/2012  . Tachycardia     Past Surgical History:  Procedure Laterality Date  . NO PAST SURGERIES      Family History  Problem Relation Age of Onset  . Cancer Mother     skin  . Rheum arthritis Father   . Diabetes Maternal Grandfather   . Cancer Maternal Grandfather     prostate    Social History  Substance Use Topics  . Smoking status: Never Smoker  . Smokeless tobacco: Never Used  . Alcohol use No    Allergies:  Allergies  Allergen Reactions  . Augmentin [Amoxicillin-Pot Clavulanate] Nausea And Vomiting  . Avelox  [Moxifloxacin Hcl In Nacl] Nausea And Vomiting  . Biaxin [Clarithromycin] Nausea And Vomiting    Prescriptions Prior to Admission  Medication Sig Dispense Refill Last Dose  . atenolol (TENORMIN) 25 MG tablet Take 12.5 mg by mouth daily.   10/12/2016 at Unknown time  . cholecalciferol (VITAMIN D) 1000 units tablet Take 2,000 Units by mouth daily.   10/12/2016 at Unknown time  . prenatal vitamin w/FE, FA (PRENATAL 1 + 1) 27-1 MG TABS tablet Take 1 tablet by mouth daily at 12 noon.   10/12/2016 at Unknown time    Review of Systems  Constitutional: Negative for chills and fever.  Gastrointestinal: Positive for abdominal pain. Negative for constipation, diarrhea, nausea and vomiting.  Genitourinary: Positive for pelvic pain. Negative for dysuria, frequency and urgency.   Physical Exam   Blood pressure 120/71, pulse 89, temperature 98.1 F (36.7 C), temperature source Oral, resp. rate 20, height 5\' 9"  (1.753 m), weight 218 lb 8 oz (99.1 kg), last menstrual period 06/19/2016.  Physical Exam  Nursing note and vitals reviewed. Constitutional: She is oriented to person, place, and time. She appears well-developed and well-nourished. No distress.  HENT:  Head: Normocephalic.  Cardiovascular: Normal rate.   Respiratory: Effort normal.  GI: Soft. There is no tenderness. There is no rebound.  Genitourinary:  Genitourinary Comments: Uterus: AGA, FHT 141 with doppler  Cervix: closed/thick  Neurological: She is alert and oriented to person, place, and time.  Skin: Skin is warm and dry.  Psychiatric: She has a normal mood and affect.  MAU Course  Procedures  MDM 0254: D/W Dr. Billy Coastaavon, ok for DC home   Assessment and Plan   1. Round ligament pain   2. [redacted] weeks gestation of pregnancy    DC home Comfort measures reviewed  2nd Trimester precautions  Fetal kick counts RX: none  Return to MAU as needed FU with OB as planned  Follow-up Information    Lenoard AdenAAVON,RICHARD J, MD Follow up.    Specialty:  Obstetrics and Gynecology Contact information: [address] [place] [address] [phone]            Autumn CrookHogan, Jordin Vicencio Donovan 10/13/2016, 2:51 AM   Results for orders placed or performed during the hospital encounter of 10/13/16 (from the past 24 hour(s))  Urinalysis, Routine w reflex microscopic (not at [facility])     Status: Abnormal   Collection Time: 10/13/16  2:14 AM  Result Value Ref Range   Color, Urine YELLOW YELLOW   APPearance CLEAR CLEAR   Specific Gravity, Urine 1.020 1.005 - 1.030   pH 5.5 5.0 - 8.0   Glucose, UA NEGATIVE NEGATIVE mg/dL   Hgb urine dipstick NEGATIVE NEGATIVE   Bilirubin Urine NEGATIVE NEGATIVE   Ketones, ur NEGATIVE NEGATIVE mg/dL   Protein, ur NEGATIVE NEGATIVE mg/dL   Nitrite NEGATIVE NEGATIVE   Leukocytes, UA MODERATE (A) NEGATIVE  Urine microscopic-add on     Status: Abnormal   Collection Time: 10/13/16  2:14 AM  Result Value Ref Range   Squamous Epithelial / LPF 6-30 (A) NONE SEEN   WBC, UA 6-30 0 - 5 WBC/hpf   RBC / HPF 0-5 0 - 5 RBC/hpf   Bacteria, UA FEW (A) NONE SEEN

## 2016-11-23 NOTE — L&D Delivery Note (Signed)
Shoulder Dystocia Vaginal Delivery Note At 1:56 PM a viable and healthy female was delivered via Vaginal, Spontaneous Delivery.  Presentation: vertex; Position: Left,, Right,, Anterior; Station: +4.  Delivery of the head: 03/10/2017  1:55 PM First maneuver: 03/10/2017  1:55 PM, McRoberts Second maneuver: 03/10/2017  1:55 PM, Joseph Art screw (fetal shoulder rotation) Third maneuver: 03/10/2017  1:56 PM,  Delivery of posterior arm and shoulder  APGAR: 7, 9; weight 8 lb 12.7 oz (3989 g).   Placenta status: spontaneous, intact.   Cord:  with the following complications: none.  Cord pH: na  Anesthesia:  epidural Episiotomy: None Lacerations: 2nd degree Suture Repair: 2.0 vicryl rapide Est. Blood Loss (mL): 200  Mom to postpartum.  Baby to Couplet care / Skin to Skin.  Autumn Stephens J 03/10/2017, 4:17 PM

## 2017-01-21 ENCOUNTER — Encounter (HOSPITAL_COMMUNITY): Payer: Self-pay

## 2017-02-16 LAB — OB RESULTS CONSOLE GBS: STREP GROUP B AG: NEGATIVE

## 2017-02-26 ENCOUNTER — Other Ambulatory Visit: Payer: Self-pay | Admitting: Obstetrics and Gynecology

## 2017-03-04 ENCOUNTER — Telehealth (HOSPITAL_COMMUNITY): Payer: Self-pay | Admitting: *Deleted

## 2017-03-04 ENCOUNTER — Encounter (HOSPITAL_COMMUNITY): Payer: Self-pay | Admitting: *Deleted

## 2017-03-04 NOTE — Telephone Encounter (Signed)
Preadmission screen  

## 2017-03-09 ENCOUNTER — Inpatient Hospital Stay (HOSPITAL_COMMUNITY): Admission: RE | Admit: 2017-03-09 | Payer: 59 | Source: Ambulatory Visit

## 2017-03-09 NOTE — H&P (Signed)
Autumn Stephens is a 26 y.o. female presenting for iol for gestational HTN and history of cardiac dysrhthymia on B blocker. OB History    Gravida Para Term Preterm AB Living   0 0 2   SAB TAB Ectopic Multiple Live Births   0 0 0 0 2     Past Medical History:  Diagnosis Date  . Diffuse cystic mastopathy   . Dysrhythmia    tachycardia on atenol last pregnancy  . H/O varicella   . Irritable bowel syndrome   . Maternal anemia complicating pregnancy, childbirth, or the puerperium 09/28/2012  . Migraine, unspecified, without mention of intractable migraine without mention of status migrainosus   . NSVD (normal spontaneous vaginal delivery - 11/5) 09/28/2012  . Other malaise and fatigue   . Perineal laceration, first degree 09/28/2012  . Postpartum care following vaginal delivery 09/28/2012  . Pregnancy induced hypertension   . Pyelonephritis   . Rubella nonimmune status, delivered, current hospitalization 09/28/2012  . Tachycardia    Past Surgical History:  Procedure Laterality Date  . NO PAST SURGERIES     Family History: family history includes Cancer in her maternal grandfather and mother; Diabetes in her maternal grandfather; Rheum arthritis in her father. Social History:  reports that she has never smoked. She has never used smokeless tobacco. She reports that she does not drink alcohol or use drugs.     Maternal Diabetes: No Genetic Screening: Normal Maternal Ultrasounds/Referrals: Normal Fetal Ultrasounds or other Referrals:  None Maternal Substance Abuse:  No Significant Maternal Medications:  None Significant Maternal Lab Results:  None Other Comments:  None  Review of Systems  Constitutional: Negative.   All other systems reviewed and are negative.  Maternal Medical History:  Fetal activity: Perceived fetal activity is normal.   Last perceived fetal movement was within the past hour.    Prenatal complications: PIH.   Prenatal Complications - Diabetes:  none.      Last menstrual period 06/19/2016. Maternal Exam:  Uterine Assessment: Contraction strength is mild.  Contraction frequency is rare.   Abdomen: Fetal presentation: vertex  Introitus: Normal vulva. Normal vagina.  Ferning test: not done.  Nitrazine test: not done. Amniotic fluid character: not assessed.  Pelvis: adequate for delivery.   Cervix: Cervix evaluated by digital exam.     Physical Exam  Nursing note and vitals reviewed. Constitutional: She is oriented to person, place, and time. She appears well-developed and well-nourished.  HENT:  Head: Normocephalic and atraumatic.  Neck: Normal range of motion. Neck supple.  Cardiovascular: Normal rate and regular rhythm.   Respiratory: Effort normal and breath sounds normal.  GI: Bowel sounds are normal.  Genitourinary: Vagina normal and uterus normal.  Musculoskeletal: Normal range of motion.  Neurological: She is alert and oriented to person, place, and time. She has normal reflexes.  Skin: Skin is warm and dry.  Psychiatric: She has a normal mood and affect.    Prenatal labs: ABO, Rh: A/Positive/-- (09/26 0000) Antibody: Negative (09/26 0000) Rubella: Immune (09/26 0000) RPR: Nonreactive (09/26 0000)  HBsAg: Negative (09/26 0000)  HIV: Non-reactive (09/26 0000)  GBS: Negative (03/27 0000)   Assessment/Plan: 39 weeks. Gestational HTN Cardiac Dysrhythmia IOL   Autumn Stephens J 03/09/2017, 10:21 PM

## 2017-03-10 ENCOUNTER — Encounter (HOSPITAL_COMMUNITY): Payer: Self-pay

## 2017-03-10 ENCOUNTER — Inpatient Hospital Stay (HOSPITAL_COMMUNITY): Payer: 59 | Admitting: Anesthesiology

## 2017-03-10 ENCOUNTER — Inpatient Hospital Stay (HOSPITAL_COMMUNITY)
Admission: RE | Admit: 2017-03-10 | Discharge: 2017-03-12 | DRG: 774 | Disposition: A | Discharge status: Home / Self Care | Payer: 59 | Source: Ambulatory Visit | Attending: Obstetrics and Gynecology | Admitting: Obstetrics and Gynecology

## 2017-03-10 DIAGNOSIS — O8621 Infection of kidney following delivery: Secondary | ICD-10-CM | POA: Diagnosis not present

## 2017-03-10 DIAGNOSIS — R109 Unspecified abdominal pain: Secondary | ICD-10-CM | POA: Diagnosis present

## 2017-03-10 DIAGNOSIS — O134 Gestational [pregnancy-induced] hypertension without significant proteinuria, complicating childbirth: Secondary | ICD-10-CM | POA: Diagnosis present

## 2017-03-10 DIAGNOSIS — Z881 Allergy status to other antibiotic agents status: Secondary | ICD-10-CM | POA: Diagnosis not present

## 2017-03-10 DIAGNOSIS — Z3A39 39 weeks gestation of pregnancy: Secondary | ICD-10-CM | POA: Diagnosis not present

## 2017-03-10 DIAGNOSIS — Z833 Family history of diabetes mellitus: Secondary | ICD-10-CM

## 2017-03-10 DIAGNOSIS — O9942 Diseases of the circulatory system complicating childbirth: Secondary | ICD-10-CM | POA: Diagnosis present

## 2017-03-10 DIAGNOSIS — Z79899 Other long term (current) drug therapy: Secondary | ICD-10-CM

## 2017-03-10 LAB — COMPREHENSIVE METABOLIC PANEL
ALBUMIN: 2.8 g/dL — AB (ref 3.5–5.0)
ALK PHOS: 88 U/L (ref 38–126)
ALT: 12 U/L — ABNORMAL LOW (ref 14–54)
ANION GAP: 9 (ref 5–15)
AST: 18 U/L (ref 15–41)
BUN: 9 mg/dL (ref 6–20)
CALCIUM: 10.5 mg/dL — AB (ref 8.9–10.3)
CO2: 24 mmol/L (ref 22–32)
Chloride: 104 mmol/L (ref 101–111)
Creatinine, Ser: 0.74 mg/dL (ref 0.44–1.00)
GFR calc Af Amer: >60 mL/min (ref >60)
GFR calc non Af Amer: >60 mL/min (ref >60)
GLUCOSE: 92 mg/dL (ref 65–99)
POTASSIUM: 3.4 mmol/L — AB (ref 3.5–5.1)
SODIUM: 137 mmol/L (ref 135–145)
Total Bilirubin: 0.8 mg/dL (ref 0.3–1.2)
Total Protein: 6.2 g/dL — ABNORMAL LOW (ref 6.5–8.1)

## 2017-03-10 LAB — CBC
HCT: 31.8 % — ABNORMAL LOW (ref 36.0–46.0)
HEMATOCRIT: 32.6 % — AB (ref 36.0–46.0)
HEMOGLOBIN: 10.6 g/dL — AB (ref 12.0–15.0)
Hemoglobin: 10.6 g/dL — ABNORMAL LOW (ref 12.0–15.0)
MCH: 31.1 pg (ref 26.0–34.0)
MCH: 31.3 pg (ref 26.0–34.0)
MCHC: 32.5 g/dL (ref 30.0–36.0)
MCHC: 33.3 g/dL (ref 30.0–36.0)
MCV: 95.6 fL (ref 78.0–100.0)
MCV: 93.8 fL (ref 78.0–100.0)
PLATELETS: 165 10*3/uL (ref 150–400)
Platelets: 184 10*3/uL (ref 150–400)
RBC: 3.41 MIL/uL — ABNORMAL LOW (ref 3.87–5.11)
RBC: 3.39 MIL/uL — AB (ref 3.87–5.11)
RDW: 14.8 % (ref 11.5–15.5)
RDW: 15 % (ref 11.5–15.5)
WBC: 9.4 10*3/uL (ref 4.0–10.5)
WBC: 8.8 10*3/uL (ref 4.0–10.5)

## 2017-03-10 LAB — TYPE AND SCREEN
ABO/RH(D): A POS
ANTIBODY SCREEN: NEGATIVE

## 2017-03-10 LAB — LACTATE DEHYDROGENASE: LDH: 125 U/L (ref 98–192)

## 2017-03-10 LAB — ABO/RH: ABO/RH(D): A POS

## 2017-03-10 MED ORDER — IBUPROFEN 600 MG PO TABS
600.0000 mg | ORAL_TABLET | Freq: Four times a day (QID) | ORAL | Status: DC
  Administered 2017-03-10 – 2017-03-12 (×8): 600 mg via ORAL
  Filled 2017-03-10 (×8): qty 1

## 2017-03-10 MED ORDER — DIPHENHYDRAMINE HCL 50 MG/ML IJ SOLN: 12.5000 mg | INTRAMUSCULAR | Status: DC | PRN

## 2017-03-10 MED ORDER — COCONUT OIL OIL
1.0000 "application " | TOPICAL_OIL | Status: DC | PRN
  Filled 2017-03-10 (×2): qty 120

## 2017-03-10 MED ORDER — METHYLERGONOVINE MALEATE 0.2 MG PO TABS: 0.2000 mg | ORAL_TABLET | ORAL | Status: DC | PRN

## 2017-03-10 MED ORDER — TERBUTALINE SULFATE 1 MG/ML IJ SOLN
0.2500 mg | Freq: Once | INTRAMUSCULAR | Status: DC | PRN
  Filled 2017-03-10: qty 1

## 2017-03-10 MED ORDER — PRENATAL MULTIVITAMIN CH
1.0000 | ORAL_TABLET | Freq: Every day | ORAL | Status: DC
  Administered 2017-03-11 – 2017-03-12 (×2): 1 via ORAL
  Filled 2017-03-10 (×2): qty 1

## 2017-03-10 MED ORDER — LIDOCAINE HCL (PF) 1 % IJ SOLN
INTRAMUSCULAR | Status: DC | PRN
  Administered 2017-03-10 (×2): 5 mL

## 2017-03-10 MED ORDER — EPHEDRINE 5 MG/ML INJ
10.0000 mg | INTRAVENOUS | Status: DC | PRN
  Filled 2017-03-10: qty 2

## 2017-03-10 MED ORDER — ATENOLOL 12.5 MG HALF TABLET
12.5000 mg | ORAL_TABLET | Freq: Every day | ORAL | Status: DC
  Filled 2017-03-10 (×4): qty 1

## 2017-03-10 MED ORDER — ONDANSETRON HCL 4 MG/2ML IJ SOLN
4.0000 mg | Freq: Four times a day (QID) | INTRAMUSCULAR | Status: DC | PRN
Start: 2017-03-10 — End: 2017-03-10

## 2017-03-10 MED ORDER — OXYTOCIN 40 UNITS IN LACTATED RINGERS INFUSION - SIMPLE MED
1.0000 m[IU]/min | INTRAVENOUS | Status: DC
  Administered 2017-03-10: 2 m[IU]/min via INTRAVENOUS
  Administered 2017-03-10: 4 m[IU]/min via INTRAVENOUS

## 2017-03-10 MED ORDER — OXYCODONE-ACETAMINOPHEN 5-325 MG PO TABS: 2.0000 | ORAL_TABLET | ORAL | Status: DC | PRN

## 2017-03-10 MED ORDER — LACTATED RINGERS IV SOLN: 500.0000 mL | Freq: Once | INTRAVENOUS | Status: DC

## 2017-03-10 MED ORDER — SOD CITRATE-CITRIC ACID 500-334 MG/5ML PO SOLN: 30.0000 mL | ORAL | Status: DC | PRN

## 2017-03-10 MED ORDER — DIBUCAINE 1 % RE OINT: 1.0000 "application " | TOPICAL_OINTMENT | RECTAL | Status: DC | PRN

## 2017-03-10 MED ORDER — OXYCODONE-ACETAMINOPHEN 5-325 MG PO TABS: 1.0000 | ORAL_TABLET | ORAL | Status: DC | PRN

## 2017-03-10 MED ORDER — ACETAMINOPHEN 325 MG PO TABS
650.0000 mg | ORAL_TABLET | ORAL | Status: DC | PRN
  Administered 2017-03-10 – 2017-03-11 (×2): 650 mg via ORAL
  Filled 2017-03-10 (×2): qty 2

## 2017-03-10 MED ORDER — WITCH HAZEL-GLYCERIN EX PADS: 1.0000 "application " | MEDICATED_PAD | CUTANEOUS | Status: DC | PRN

## 2017-03-10 MED ORDER — PHENYLEPHRINE 40 MCG/ML (10ML) SYRINGE FOR IV PUSH (FOR BLOOD PRESSURE SUPPORT)
80.0000 ug | PREFILLED_SYRINGE | INTRAVENOUS | Status: DC | PRN
  Filled 2017-03-10: qty 5

## 2017-03-10 MED ORDER — MISOPROSTOL 25 MCG QUARTER TABLET
25.0000 ug | ORAL_TABLET | ORAL | Status: DC
  Administered 2017-03-10 (×2): 25 ug via VAGINAL
  Filled 2017-03-10 (×6): qty 1

## 2017-03-10 MED ORDER — ONDANSETRON HCL 4 MG PO TABS: 4.0000 mg | ORAL_TABLET | ORAL | Status: DC | PRN

## 2017-03-10 MED ORDER — ZOLPIDEM TARTRATE 5 MG PO TABS: 5.0000 mg | ORAL_TABLET | Freq: Every evening | ORAL | Status: DC | PRN

## 2017-03-10 MED ORDER — FENTANYL 2.5 MCG/ML BUPIVACAINE 1/10 % EPIDURAL INFUSION (WH - ANES)
14.0000 mL/h | INTRAMUSCULAR | Status: DC | PRN
  Administered 2017-03-10: 14 mL/h via EPIDURAL
  Filled 2017-03-10: qty 100

## 2017-03-10 MED ORDER — DIPHENHYDRAMINE HCL 25 MG PO CAPS: 25.0000 mg | ORAL_CAPSULE | Freq: Four times a day (QID) | ORAL | Status: DC | PRN

## 2017-03-10 MED ORDER — ACETAMINOPHEN 325 MG PO TABS: 650.0000 mg | ORAL_TABLET | ORAL | Status: DC | PRN

## 2017-03-10 MED ORDER — BENZOCAINE-MENTHOL 20-0.5 % EX AERO
1.0000 "application " | INHALATION_SPRAY | CUTANEOUS | Status: DC | PRN
  Administered 2017-03-10: 1 via TOPICAL
  Filled 2017-03-10: qty 56

## 2017-03-10 MED ORDER — LIDOCAINE HCL (PF) 1 % IJ SOLN
30.0000 mL | INTRAMUSCULAR | Status: DC | PRN
  Filled 2017-03-10: qty 30

## 2017-03-10 MED ORDER — SENNOSIDES-DOCUSATE SODIUM 8.6-50 MG PO TABS
2.0000 | ORAL_TABLET | ORAL | Status: DC
  Administered 2017-03-10 – 2017-03-11 (×2): 2 via ORAL
  Filled 2017-03-10 (×2): qty 2

## 2017-03-10 MED ORDER — OXYTOCIN 40 UNITS IN LACTATED RINGERS INFUSION - SIMPLE MED: 2.5000 [IU]/h | INTRAVENOUS | Status: DC

## 2017-03-10 MED ORDER — PHENYLEPHRINE 40 MCG/ML (10ML) SYRINGE FOR IV PUSH (FOR BLOOD PRESSURE SUPPORT)
80.0000 ug | PREFILLED_SYRINGE | INTRAVENOUS | Status: DC | PRN
  Filled 2017-03-10: qty 5
  Filled 2017-03-10: qty 10

## 2017-03-10 MED ORDER — OXYTOCIN BOLUS FROM INFUSION
500.0000 mL | Freq: Once | INTRAVENOUS | Status: AC
  Administered 2017-03-10: 500 mL via INTRAVENOUS

## 2017-03-10 MED ORDER — ONDANSETRON HCL 4 MG/2ML IJ SOLN: 4.0000 mg | INTRAMUSCULAR | Status: DC | PRN

## 2017-03-10 MED ORDER — LACTATED RINGERS IV SOLN
500.0000 mL | INTRAVENOUS | Status: DC | PRN
Start: 2017-03-10 — End: 2017-03-10

## 2017-03-10 MED ORDER — FENTANYL CITRATE (PF) 100 MCG/2ML IJ SOLN: 50.0000 ug | INTRAMUSCULAR | Status: DC | PRN

## 2017-03-10 MED ORDER — METHYLERGONOVINE MALEATE 0.2 MG/ML IJ SOLN: 0.2000 mg | INTRAMUSCULAR | Status: DC | PRN

## 2017-03-10 MED ORDER — FLEET ENEMA 7-19 GM/118ML RE ENEM: 1.0000 | ENEMA | RECTAL | Status: DC | PRN

## 2017-03-10 MED ORDER — TETANUS-DIPHTH-ACELL PERTUSSIS 5-2.5-18.5 LF-MCG/0.5 IM SUSP: 0.5000 mL | Freq: Once | INTRAMUSCULAR | Status: DC

## 2017-03-10 MED ORDER — OXYTOCIN 40 UNITS IN LACTATED RINGERS INFUSION - SIMPLE MED
1.0000 m[IU]/min | INTRAVENOUS | Status: DC
  Filled 2017-03-10: qty 1000

## 2017-03-10 MED ORDER — SIMETHICONE 80 MG PO CHEW: 80.0000 mg | CHEWABLE_TABLET | ORAL | Status: DC | PRN

## 2017-03-10 MED ORDER — LACTATED RINGERS IV SOLN
INTRAVENOUS | Status: DC
  Administered 2017-03-10 (×3): via INTRAVENOUS

## 2017-03-10 NOTE — Anesthesia Pain Management Evaluation Note (Signed)
  CRNA Pain Management Visit Note  Patient: Autumn Stephens, 26 y.o., female  "Hello I am a member of the anesthesia team at [facility]. We have an anesthesia team available at all times to provide care throughout the hospital, including epidural management and anesthesia for C-section. I don't know your plan for the delivery whether it a natural birth, water birth, IV sedation, nitrous supplementation, doula or epidural, but we want to meet your pain goals."   1.Was your pain managed to your expectations on prior hospitalizations?   Yes   2.What is your expectation for pain management during this hospitalization?     Epidural  3.How can we help you reach that goal?   Record the patient's initial score and the patient's pain goal.   Pain: 5  Pain Goal: 7 The [facility] wants you to be able to say your pain was always managed very well.  Laban Emperor 03/10/2017

## 2017-03-10 NOTE — Lactation Note (Signed)
This note was copied from a baby's chart. Lactation Consultation Note  Patient Name: Autumn Stephens Date: 03/10/2017 Reason for consult: Initial assessment Baby at 6 hr of life. Mom is reporting "some pain when he is latched". She stopped latching her 1st child when they got home and pumped for 54m. The baby was jaundice and sleepy at the breast so her MD told her to pump. Her 2nd child had a "tongue tie that we had clipped". She reported latching was very painful even with a NS. She is worried this baby may have a "tongue tie". She has already talked to the MD about it. She is agreeable to letting the baby latch today. Discussed baby behavior, feeding frequency, and nipple care. She stated she can manually express and has spoon in room. Given lactation handouts. Aware of OP services and support group.     Maternal Data Has patient been taught Hand Expression?: Yes Does the patient have breastfeeding experience prior to this delivery?: Yes  Feeding Feeding Type: Breast Fed Length of feed: 15 min  LATCH Score/Interventions                      Lactation Tools Discussed/Used WIC Program: No   Consult Status Consult Status: Follow-up Date: 03/11/17 Follow-up type: In-patient    Rulon Eisenmenger 03/10/2017, 8:36 PM

## 2017-03-10 NOTE — Anesthesia Procedure Notes (Signed)
Epidural Patient location during procedure: OB  Staffing Anesthesiologist: Klair Leising Performed: anesthesiologist   Preanesthetic Checklist Completed: patient identified, site marked, surgical consent, pre-op evaluation, timeout performed, IV checked, risks and benefits discussed and monitors and equipment checked  Epidural Patient position: sitting Prep: DuraPrep Patient monitoring: heart rate, continuous pulse ox and blood pressure Approach: right paramedian Location: L3-L4 Injection technique: LOR saline  Needle:  Needle type: Tuohy  Needle gauge: 17 G Needle length: 9 cm and 9 Needle insertion depth: 7 cm Catheter type: closed end flexible Catheter size: 20 Guage Catheter at skin depth: 11 cm Test dose: negative  Assessment Events: blood not aspirated, injection not painful, no injection resistance, negative IV test and no paresthesia  Additional Notes Patient identified. Risks/Benefits/Options discussed with patient including but not limited to bleeding, infection, nerve damage, paralysis, failed block, incomplete pain control, headache, blood pressure changes, nausea, vomiting, reactions to medication both or allergic, itching and postpartum back pain. Confirmed with bedside nurse the patient's most recent platelet count. Confirmed with patient that they are not currently taking any anticoagulation, have any bleeding history or any family history of bleeding disorders. Patient expressed understanding and wished to proceed. All questions were answered. Sterile technique was used throughout the entire procedure. Please see nursing notes for vital signs. Test dose was given through epidural needle and negative prior to continuing to dose epidural or start infusion. Warning signs of high block given to the patient including shortness of breath, tingling/numbness in hands, complete motor block, or any concerning symptoms with instructions to call for help. Patient was given  instructions on fall risk and not to get out of bed. All questions and concerns addressed with instructions to call with any issues.     

## 2017-03-10 NOTE — Anesthesia Preprocedure Evaluation (Signed)
Anesthesia Evaluation  Patient identified by MRN, date of birth, ID band Patient awake    Reviewed: Allergy & Precautions, H&P , NPO status , Patient's Chart, lab work & pertinent test results  History of Anesthesia Complications Negative for: history of anesthetic complications  Airway Mallampati: II  TM Distance: >3 FB Neck ROM: full    Dental no notable dental hx. (+) Teeth Intact   Pulmonary neg pulmonary ROS,    Pulmonary exam normal breath sounds clear to auscultation       Cardiovascular hypertension, negative cardio ROS Normal cardiovascular exam Rhythm:regular Rate:Normal     Neuro/Psych negative neurological ROS  negative psych ROS   GI/Hepatic negative GI ROS, Neg liver ROS,   Endo/Other  negative endocrine ROS  Renal/GU negative Renal ROS  negative genitourinary   Musculoskeletal   Abdominal   Peds  Hematology negative hematology ROS (+)   Anesthesia Other Findings   Reproductive/Obstetrics (+) Pregnancy                             Anesthesia Physical Anesthesia Plan  ASA: II  Anesthesia Plan: Epidural   Post-op Pain Management:    Induction:   Airway Management Planned:   Additional Equipment:   Intra-op Plan:   Post-operative Plan:   Informed Consent: I have reviewed the patients History and Physical, chart, labs and discussed the procedure including the risks, benefits and alternatives for the proposed anesthesia with the patient or authorized representative who has indicated his/her understanding and acceptance.     Plan Discussed with:   Anesthesia Plan Comments:         Anesthesia Quick Evaluation  

## 2017-03-11 LAB — CBC
HEMATOCRIT: 29.8 % — AB (ref 36.0–46.0)
Hemoglobin: 9.8 g/dL — ABNORMAL LOW (ref 12.0–15.0)
MCH: 31.1 pg (ref 26.0–34.0)
MCHC: 32.9 g/dL (ref 30.0–36.0)
MCV: 94.6 fL (ref 78.0–100.0)
Platelets: 149 10*3/uL — ABNORMAL LOW (ref 150–400)
RBC: 3.15 MIL/uL — ABNORMAL LOW (ref 3.87–5.11)
RDW: 15.1 % (ref 11.5–15.5)
WBC: 8.4 10*3/uL (ref 4.0–10.5)

## 2017-03-11 LAB — RPR: RPR Ser Ql: NONREACTIVE

## 2017-03-11 NOTE — Lactation Note (Signed)
This note was copied from a baby's chart. Lactation Consultation Note  Patient Name: Autumn Stephens [id number] Date: 03/11/2017 Reason for consult: Follow-up assessment;Breast/nipple pain  Mom is having severe pain with latches.  Baby has a short, tight lingual frenulum.  Tongue elevation is difficult for baby.  Assisted with postioning baby skin to skin in football hold.  Baby opens wide and latch looks good but mom in serve pain.  Nipples are abraded.  24 mm nipple shield applied and mom comfortable with feeding.  Observed baby actively feed with good swallows noted.  Mom knows she can look into frenotomy post discharge if pain continues.  She is established with and ENT who did her previous baby's tongue revision.  Encouraged to call out with concerns/assist prn.   Maternal Data    Feeding Feeding Type: Breast Fed Length of feed: 20 min  LATCH Score/Interventions Latch: Grasps breast easily, tongue down, lips flanged, rhythmical sucking. Intervention(s): Assist with latch;Breast massage;Breast compression  Audible Swallowing: Spontaneous and intermittent  Type of Nipple: Everted at rest and after stimulation  Comfort (Breast/Nipple): Filling, red/small blisters or bruises, mild/mod discomfort  Problem noted: Mild/Moderate discomfort Interventions (Mild/moderate discomfort): Post-pump  Hold (Positioning): Assistance needed to correctly position infant at breast and maintain latch. Intervention(s): Breastfeeding basics reviewed;Support Pillows;Position options;Skin to skin  LATCH Score: 8  Lactation Tools Discussed/Used Tools: Nipple Shields Nipple shield size: 24   Consult Status Consult Status: Follow-up Date: 03/05/17 Follow-up type: In-patient    Huston Foley 03/11/2017, 2:00 PM

## 2017-03-11 NOTE — Progress Notes (Signed)
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 Unknown   BMI 37.21 kg/m    LABS:              Recent Labs  03/10/17 0938 03/11/17 0513  WBC 8.8 8.4  HGB 10.6* 9.8*  PLT 165 149*               Blood type: --/--/A POS, A POS (04/18 0142)  Rubella: Immune (09/26 0000)                     I&O: Intake/Output      04/18 0701 - 04/19 0700 04/19 0701 - 04/20 0700   Urine (mL/kg/hr) 900 (0.3)    Blood 200 (0.1)    Total Output 1100     Net -1100                       Physical Exam:             Alert and oriented X3  Abdomen: soft, non-tender, non-distended              Fundus: firm, non-tender, U/1  Perineum: 2nd degree lacerations repaired 2.0 vicryl rapide- mild edema, ice pack in place   Lochia: moderate  Extremities: mild pedal edema- non pitting, no calf pain or tenderness    A: PPD # 1 s/p shoulder dystocia   Doing well - stable status  P: Routine post partum orders  DC home tomorrow   Steward Drone BSN, SNM 03/11/2017, 9:24 AM

## 2017-03-11 NOTE — Anesthesia Postprocedure Evaluation (Signed)
Anesthesia Post Note  Patient: Autumn Stephens  Procedure(s) Performed: * No procedures listed *  Patient location during evaluation: Mother Baby Anesthesia Type: Epidural Level of consciousness: awake and alert and oriented Pain management: satisfactory to patient Vital Signs Assessment: post-procedure vital signs reviewed and stable Respiratory status: spontaneous breathing and nonlabored ventilation Cardiovascular status: stable Postop Assessment: no headache, no backache, no signs of nausea or vomiting, adequate PO intake and patient able to bend at knees (patient up walking) Anesthetic complications: no        Last Vitals:  Vitals:   03/10/17 2054 03/11/17 0449  BP: 125/75 125/75  Pulse: 100 88  Resp: 18 18  Temp: 36.7 C 36.4 C    Last Pain:  Vitals:   03/11/17 0502  TempSrc:   PainSc: 1    Pain Goal: Patients Stated Pain Goal: 5 (03/11/17 0502)               Madison Hickman

## 2017-03-12 ENCOUNTER — Inpatient Hospital Stay (HOSPITAL_COMMUNITY)
Admission: AD | Admit: 2017-03-12 | Discharge: 2017-03-13 | Disposition: A | Discharge status: Home / Self Care | Payer: 59 | Source: Ambulatory Visit | Attending: Obstetrics & Gynecology | Admitting: Obstetrics & Gynecology

## 2017-03-12 ENCOUNTER — Encounter (HOSPITAL_COMMUNITY): Payer: Self-pay | Admitting: *Deleted

## 2017-03-12 DIAGNOSIS — N12 Tubulo-interstitial nephritis, not specified as acute or chronic: Secondary | ICD-10-CM

## 2017-03-12 DIAGNOSIS — O8621 Infection of kidney following delivery: Secondary | ICD-10-CM | POA: Insufficient documentation

## 2017-03-12 DIAGNOSIS — Z881 Allergy status to other antibiotic agents status: Secondary | ICD-10-CM | POA: Insufficient documentation

## 2017-03-12 LAB — URINALYSIS, ROUTINE W REFLEX MICROSCOPIC
Bilirubin Urine: NEGATIVE
Glucose, UA: NEGATIVE mg/dL
Ketones, ur: NEGATIVE mg/dL
Nitrite: POSITIVE — AB
PROTEIN: NEGATIVE mg/dL
SPECIFIC GRAVITY, URINE: 1.013 (ref 1.005–1.030)
pH: 5 (ref 5.0–8.0)

## 2017-03-12 NOTE — Lactation Note (Addendum)
This note was copied from a baby's chart. Lactation Consultation Note  Patient Name: Autumn Stephens [id number] Date: 03/12/2017 Reason for consult: Follow-up assessment;Other (Comment);Infant weight loss (S/P anterior Frenotomy, 6% weight loss ) Baby is 44 hours old, as LC walked in the room, mom just finished breast feeding. Baby still acting hungry. LC assisted to re-latch with depth  and baby fed for 20 mins. With depth.  Swallows noted, breast softened. Initially mom felt some discomfort , improved with breast compressions, nipple well rounded when baby released. Baby still acting hungry, LC assisted to latch on the left breast with depth a 2F SNS ( 9 ml ) baby tolerated the SNS well and eventually mom felt more comfortable with latch. Baby content after feeding. Wet diaper  Changed by LC.  Per mom the left is sorer than the right.  Breast are fuller bilaterally today. LC discussed with mom soreness nipple Prevention; see LC plan below  MBU RN P. Cangialosi aware of the [facility] plan below.  LC instructed mom on the use shells , hand pump ( for pre- pumping ) and review of DEBP )    LC Plan: due to sore nipples / and to limit time out from under the Bili lights:  Shells between feedings both breast / except when sleeping.  Prior to latch - breast massage, hand express , pre - pump to make the nipple areola complex more elastic for a deeper latch;  Feed on the right breast with SNS up to 30 ml ( 2F SNS ) after the baby is latched.  Football Position recommended for now due to soreness, recessed chin, and S/P anterior frenotomy.  And post pump both breast for 10 -15 mins , save milk to use in the SNS next feeding.    Maternal Data Has patient been taught Hand Expression?: Yes  Feeding Feeding Type: Breast Fed (right breast / football )  LATCH Score/Interventions Latch: Grasps breast easily, tongue down, lips flanged, rhythmical sucking. Intervention(s): Adjust position;Assist with  latch;Breast massage;Breast compression  Audible Swallowing: Spontaneous and intermittent  Type of Nipple: Everted at rest and after stimulation  Comfort (Breast/Nipple): Filling, red/small blisters or bruises, mild/mod discomfort  Problem noted: Mild/Moderate discomfort (per mom improved )  Hold (Positioning): Assistance needed to correctly position infant at breast and maintain latch. Intervention(s): Breastfeeding basics reviewed;Support Pillows;Position options;Skin to skin  LATCH Score: 8  Lactation Tools Discussed/Used Tools: Pump;Shells;Other (comment) (coconut oil ) Nipple shield size:  (no NS used or needed ) Shell Type:  (and a hand pump for pre- pumping ) Breast pump type: Double-Electric Breast Pump Pump Review: Setup, frequency, and cleaning (already set up / reviewed ) Initiated by:: reviewed / MAI  Date initiated:: 03/12/17   Consult Status Consult Status: Follow-up Date: 03/12/17 Follow-up type: In-patient    Matilde Sprang Arby Dahir 03/12/2017, 10:50 AM

## 2017-03-12 NOTE — MAU Note (Signed)
Urine sent to lab 

## 2017-03-12 NOTE — Progress Notes (Signed)
PPD2 SVD:   S:  Pt reports feeling  sore/ Tolerating po/ Voiding without problems/ No n/v/ Bleeding is moderate/ Pain controlled withnone  Newborn info baby on phototherapy   O:  A & O x 3 fine/ VS: Blood pressure 116/69, pulse 88, temperature 98.2 F (36.8 C), temperature source Oral, resp. rate 18, height  (1.753 m), weight 114.3 kg (252 lb), last menstrual period 06/19/2016, SpO2 97 %, unknown if currently breastfeeding.  LABS: No results found for this or any previous visit (from the past 24 hour(s)).  I&O: No intake/output data recorded.   No intake/output data recorded.  Lungs: chest clear, no wheezing, rales, normal symmetric air entry, Heart exam - S1, S2 normal, no murmur, no gallop, rate regular  Heart: regular rate and rhythm, S1, S2 normal, no murmur, click, rub or gallop  Abdomen: soft obese uterus tender at umb  Perineum: healing with good reapproximation  Lochia: mod  Extremities:1+ edema    A/P: PPD # 2/ [id number]  Doing well  Continue routine post partum orders  D/c home PP booklet given

## 2017-03-12 NOTE — MAU Note (Signed)
SVD 03/10/17. Just d/c home today. Baby still here due to jaundice. Breastfeeding. Having L flank pain since 1500. Dull pain. Constant. After last baby had pyleonephritis and had to be readmitted for several days

## 2017-03-12 NOTE — Discharge Instructions (Signed)
Call if temperature greater than equal to 100.4, nothing per vagina for 4-6 weeks or severe nausea vomiting, increased incisional pain , drainage or redness in the incision site, no straining with bowel movements, showers no bath °

## 2017-03-13 ENCOUNTER — Ambulatory Visit: Payer: Self-pay

## 2017-03-13 DIAGNOSIS — N12 Tubulo-interstitial nephritis, not specified as acute or chronic: Secondary | ICD-10-CM | POA: Diagnosis not present

## 2017-03-13 LAB — CBC WITH DIFFERENTIAL/PLATELET
BASOS ABS: 0 10*3/uL (ref 0.0–0.1)
BASOS PCT: 0 %
Eosinophils Absolute: 0.1 10*3/uL (ref 0.0–0.7)
Eosinophils Relative: 1 %
HEMATOCRIT: 29.3 % — AB (ref 36.0–46.0)
Hemoglobin: 9.9 g/dL — ABNORMAL LOW (ref 12.0–15.0)
Lymphocytes Relative: 21 %
Lymphs Abs: 1.9 10*3/uL (ref 0.7–4.0)
MCH: 31.6 pg (ref 26.0–34.0)
MCHC: 33.8 g/dL (ref 30.0–36.0)
MCV: 93.6 fL (ref 78.0–100.0)
MONO ABS: 0.4 10*3/uL (ref 0.1–1.0)
Monocytes Relative: 4 %
NEUTROS ABS: 6.6 10*3/uL (ref 1.7–7.7)
Neutrophils Relative %: 74 %
PLATELETS: 172 10*3/uL (ref 150–400)
RBC: 3.13 MIL/uL — ABNORMAL LOW (ref 3.87–5.11)
RDW: 15.1 % (ref 11.5–15.5)
WBC: 9 10*3/uL (ref 4.0–10.5)

## 2017-03-13 MED ORDER — OXYCODONE-ACETAMINOPHEN 5-325 MG PO TABS
1.0000 | ORAL_TABLET | Freq: Once | ORAL | Status: AC
  Administered 2017-03-13: 1 via ORAL
  Filled 2017-03-13: qty 1

## 2017-03-13 MED ORDER — CEFTRIAXONE SODIUM 1 G IJ SOLR
1.0000 g | Freq: Once | INTRAMUSCULAR | Status: AC
  Administered 2017-03-13: 1 g via INTRAMUSCULAR
  Filled 2017-03-13: qty 10

## 2017-03-13 MED ORDER — CEPHALEXIN 500 MG PO CAPS: 500.0000 mg | ORAL_CAPSULE | Freq: Four times a day (QID) | ORAL | 0 refills | Status: DC

## 2017-03-13 MED ORDER — OXYCODONE-ACETAMINOPHEN 5-325 MG PO TABS: 1.0000 | ORAL_TABLET | Freq: Four times a day (QID) | ORAL | 0 refills | Status: DC | PRN

## 2017-03-13 NOTE — Discharge Summary (Addendum)
OB Discharge Summary     Patient Name: Autumn Stephens DOB: February 08, 1991 MRN: [id number]  Date of admission: 03/10/2017 Delivering MD: Olivia Mackie   Date of discharge: 03/12/2017  Admitting diagnosis: INDUCTION Intrauterine pregnancy: [redacted]w[redacted]d     Secondary diagnosis:  Principal Problem:   Postpartum care following vaginal delivery (4/18) Active Problems:   Current use of beta blocker   Shoulder dystocia, delivered   Second-degree perineal laceration, with delivery  Additional problem: none     Discharge diagnosis: Term Pregnancy Delivered                                                                                                Post partum procedures:none  Augmentation: AROM, Pitocin and Cytotec  Complications: shoulder dystocia  Hospital course:  Induction of Labor With Vaginal Delivery   26 y.o. yo G3P3003 at [redacted]w[redacted]d was admitted to the hospital 03/10/2017 for induction of labor.  Indication for induction: Gestational hypertension.  Patient had an uncomplicated labor course as follows: Membrane Rupture Time/Date: 7:50 AM ,03/10/2017   Intrapartum Procedures: Episiotomy: None [1]                                         Lacerations:  2nd degree [3]  Patient had delivery of a Viable infant.  Information for the patient's newborn:  Jakera, Beaupre [[id number]]  Delivery Method: Vaginal, Spontaneous Delivery (Filed from Delivery Summary)   03/10/2017  Details of delivery can be found in separate delivery note.  Patient had a routine postpartum course. Patient is discharged home 03/12/2017.   Physical exam  Vitals:   03/10/17 2054 03/11/17 0449 03/11/17 1700 03/12/17 0604  BP: 125/75 125/75 124/81 116/69  Pulse: 100 88 88 88  Resp: Temp: 98 F (36.7 C) 97.5 F (36.4 C) 97.9 F (36.6 C) 98.2 F (36.8 C)  TempSrc: Oral Oral Oral Oral  SpO2: 99% 98% 99% 97%  Weight:      Height:       General: alert, cooperative and no distress Lochia:  appropriate Uterine Fundus: firm Incision: N/A DVT Evaluation: No evidence of DVT seen on physical exam. Labs: Lab Results  Component Value Date   WBC 9.0 03/13/2017   HGB 9.9 (L) 03/13/2017   HCT 29.3 (L) 03/13/2017   MCV 93.6 03/13/2017   PLT 172 03/13/2017   CMP Latest Ref Rng & Units 03/10/2017  Glucose 65 - 99 mg/dL 92  BUN 6 - 20 mg/dL 9  Creatinine 7.82 - 9.56 mg/dL 2.13  Sodium 086 - 578 mmol/L 137  Potassium 3.5 - 5.1 mmol/L 3.4(L)  Chloride 101 - 111 mmol/L 104  CO2 22 - 32 mmol/L 24  Calcium 8.9 - 10.3 mg/dL 10.5(H)  Total Protein 6.5 - 8.1 g/dL 6.2(L)  Total Bilirubin 0.3 - 1.2 mg/dL 0.8  Alkaline Phos 38 - 126 U/L 88  AST 15 - 41 U/L 18  ALT 14 - 54 U/L 12(L)    Discharge instruction: per After  Visit Summary and "Baby and Me Booklet".  After visit meds:  Allergies as of 03/12/2017      Reactions   Augmentin [amoxicillin-pot Clavulanate] Nausea And Vomiting   Avelox [moxifloxacin Hcl In Nacl] Nausea And Vomiting   Biaxin [clarithromycin] Nausea And Vomiting   Moxifloxacin Nausea And Vomiting      Medication List    TAKE these medications   calcium carbonate 500 MG chewable tablet Commonly known as:  TUMS - dosed in mg elemental calcium Chew 1-2 tablets by mouth daily.   prenatal vitamin w/FE, FA 27-1 MG Tabs tablet Take 1 tablet by mouth daily at 12 noon.       Diet: routine diet  Activity: Advance as tolerated. Pelvic rest for 6 weeks.   Outpatient follow up:6 weeks Follow up Appt:No future appointments. Follow up Visit:No Follow-up on file.  Postpartum contraception: Not Discussed  Newborn Data: Live born female  Birth Weight: 8 lb 12.7 oz (3989 g) APGAR: 7, 9  Baby Feeding: Breast Disposition:home with mother   03/13/2017 Serita Kyle, MD

## 2017-03-13 NOTE — Lactation Note (Signed)
This note was copied from a baby's chart. Lactation Consultation Note  Patient Name: Autumn Stephens [id number] Date: 03/13/2017 Reason for consult: Follow-up assessment Mom requesting assist.  She is still having discomfort when not using the shield.  Breasts are filling.  Mom would like a pre and post weight for reassurance.  She would like to use nipple shield for this feeding.  24 mm nipple shield applied correctly per mom.  Baby latches easily and deep with shield.  Mom with very minimal discomfort.  Baby nursed well on both breasts for 15 minutes per side.  Encouraged mom to do a lot of breast massage and compression with feeding.  Baby swallowing during the feeding.  Baby transferred 15 mls which is adequate with milk just coming in.  Recommended mom post pump after 4-6 feedings per day and give expressed milk back to baby.  Instructed to keep feeding diary.  Reviewed outpatient services and support and encouraged to call prn.  Mom states she will call if appointment desired.  Maternal Data    Feeding Feeding Type: Breast Fed Length of feed: 30 min  LATCH Score/Interventions Latch: Grasps breast easily, tongue down, lips flanged, rhythmical sucking. (with 24 mm nipple shield) Intervention(s): Skin to skin;Teach feeding cues;Waking techniques Intervention(s): Adjust position;Assist with latch;Breast massage;Breast compression  Audible Swallowing: Spontaneous and intermittent Intervention(s): Skin to skin;Hand expression;Alternate breast massage  Type of Nipple: Everted at rest and after stimulation  Comfort (Breast/Nipple): Filling, red/small blisters or bruises, mild/mod discomfort  Problem noted: Mild/Moderate discomfort  Hold (Positioning): Assistance needed to correctly position infant at breast and maintain latch. Intervention(s): Breastfeeding basics reviewed;Support Pillows;Position options;Skin to skin  LATCH Score: 8  Lactation Tools Discussed/Used Tools: Nipple  Shields Nipple shield size: 24   Consult Status Consult Status: Complete    Arzu Mcgaughey S 03/13/2017, 12:12 PM

## 2017-03-13 NOTE — Progress Notes (Signed)
Written and verbal d/c instructions given and understanding voiced. 

## 2017-03-13 NOTE — Discharge Instructions (Signed)
Pyelonephritis, Adult Pyelonephritis is a kidney infection. The kidneys are the organs that filter a person's blood and move waste out of the bloodstream and into the urine. Urine passes from the kidneys, through the ureters, and into the bladder. There are two main types of pyelonephritis:  Infections that come on quickly without any warning (acute pyelonephritis).  Infections that last for a long period of time (chronic pyelonephritis). In most cases, the infection clears up with treatment and does not cause further problems. More severe infections or chronic infections can sometimes spread to the bloodstream or lead to other problems with the kidneys. What are the causes? This condition is usually caused by:  Bacteria traveling from the bladder to the kidney through infected urine. The urine in the bladder can become infected with bacteria from:  Bladder infection (cystitis).  Inflammation of the prostate gland (prostatitis).  Sexual intercourse, in females.  Bacteria traveling from the bloodstream to the kidney. What increases the risk? This condition is more likely to develop in:  Pregnant women.  Older people.  People who have diabetes.  People who have kidney stones or bladder stones.  People who have other abnormalities of the kidney or ureter.  People who have a catheter placed in the bladder.  People who have cancer.  People who are sexually active.  Women who use spermicides.  People who have had a prior urinary tract infection. What are the signs or symptoms? Symptoms of this condition include:  Frequent urination.  Strong or persistent urge to urinate.  Burning or stinging when urinating.  Abdominal pain.  Back pain.  Pain in the side or flank area.  Fever.  Chills.  Blood in the urine, or dark urine.  Nausea.  Vomiting. How is this diagnosed? This condition may be diagnosed based on:  Medical history and physical exam.  Urine  tests.  Blood tests. You may also have imaging tests of the kidneys, such as an ultrasound or CT scan. How is this treated? Treatment for this condition may depend on the severity of the infection.  If the infection is mild and is found early, you may be treated with antibiotic medicines taken by mouth. You will need to drink fluids to remain hydrated.  If the infection is more severe, you may need to stay in the hospital and receive antibiotics given directly into a vein through an IV tube. You may also need to receive fluids through an IV tube if you are not able to remain hydrated. After your hospital stay, you may need to take oral antibiotics for a period of time. Other treatments may be required, depending on the cause of the infection. Follow these instructions at home: Medicines   Take over-the-counter and prescription medicines only as told by your health care provider.  If you were prescribed an antibiotic medicine, take it as told by your health care provider. Do not stop taking the antibiotic even if you start to feel better. General instructions   Drink enough fluid to keep your urine clear or pale yellow.  Avoid caffeine, tea, and carbonated beverages. They tend to irritate the bladder.  Urinate often. Avoid holding in urine for long periods of time.  Urinate before and after sex.  After a bowel movement, women should cleanse from front to back. Use each tissue only once.  Keep all follow-up visits as told by your health care provider. This is important. Contact a health care provider if:  Your symptoms do not get better   after 2 days of treatment.  Your symptoms get worse.  You have a fever. Get help right away if:  You are unable to take your antibiotics or fluids.  You have shaking chills.  You vomit.  You have severe flank or back pain.  You have extreme weakness or fainting. This information is not intended to replace advice given to you by your  health care provider. Make sure you discuss any questions you have with your health care provider. Document Released: 11/09/2005 Document Revised: 04/16/2016 Document Reviewed: 03/04/2015 Elsevier Interactive Patient Education  2017 [employer]  

## 2017-03-13 NOTE — MAU Provider Note (Signed)
Chief Complaint: Flank Pain   First Provider Initiated Contact with Patient 03/13/17 0015     SUBJECTIVE HPI: Autumn Stephens is a 26 y.o. G3P3003 at 3 days postpartum SVD who presents to [facility] reporting severe left flank pain since 5pm  03/12/17. Hx Pyelo after last delivery. Feels the same. Also just feeling unwell. Pt was Discharged from hospital yesterday , but is rooming in w/ baby due to jaundice.   Location: left flank Quality: sharp Severity: 7/10 on pain scale Duration: 5 hours Context: Postpartum vaginal delivery w/ shoulder dystocia, had cath in labor, Hx Pyelo.  Timing: constant Modifying factors: No relief w/ Ibuprofen, position changes Associated signs and symptoms: Pos for urinary odor, mild low abd cramping, malaise, feeling sore, achy. Neg for fever, chills, dysuria. Unsure if having hematuria due to lochia.   Past Medical History:  Diagnosis Date  . Diffuse cystic mastopathy   . Dysrhythmia    tachycardia on atenol last pregnancy  . H/O varicella   . Irritable bowel syndrome   . Maternal anemia complicating pregnancy, childbirth, or the puerperium 09/28/2012  . Migraine, unspecified, without mention of intractable migraine without mention of status migrainosus   . NSVD (normal spontaneous vaginal delivery - 11/5) 09/28/2012  . Other malaise and fatigue   . Perineal laceration, first degree 09/28/2012  . Postpartum care following vaginal delivery 09/28/2012  . Pregnancy induced hypertension   . Pyelonephritis   . Rubella nonimmune status, delivered, current hospitalization 09/28/2012  . Tachycardia    OB History  Gravida Para Term Preterm AB Living  0 0 3  SAB TAB Ectopic Multiple Live Births  0 0 0 0 3    # Outcome Date GA Lbr Len/2nd Weight Sex Delivery Anes PTL Lv  3 Term 03/10/17 [redacted]w[redacted]d 05:40 / 00:26 8 lb 12.7 oz (3.989 kg) M Vag-Spont EPI  LIV     Birth Comments: facial bruising  2 Term 05/28/14 [redacted]w[redacted]d 05:12 / 01:10 8 lb 14.5 oz (4.04 kg) M  Vag-Spont EPI  LIV  1 Term 09/27/12 [redacted]w[redacted]d 06:04 / 01:21 7 lb 12.2 oz (3.521 kg) F Vag-Spont EPI  LIV     Past Surgical History:  Procedure Laterality Date  . NO PAST SURGERIES     Social History   Social History  . Marital status: Married    Spouse name: N/A  . Number of children: N/A  . Years of education: N/A   Occupational History  . Not on file.   Social History Main Topics  . Smoking status: Never Smoker  . Smokeless tobacco: Never Used  . Alcohol use No  . Drug use: No  . Sexual activity: Yes     Comment: last intercourse 5 weeks ago   Other Topics Concern  . Not on file   Social History Narrative  . No narrative on file   Family History  Problem Relation Age of Onset  . Cancer Mother     skin  . Rheum arthritis Father   . Diabetes Maternal Grandfather   . Cancer Maternal Grandfather     prostate   No current facility-administered medications on file prior to encounter.    Current Outpatient Prescriptions on File Prior to Encounter  Medication Sig Dispense Refill  . calcium carbonate (TUMS - DOSED IN MG ELEMENTAL CALCIUM) 500 MG chewable tablet Chew 1-2 tablets by mouth daily.    . prenatal vitamin w/FE, FA (PRENATAL 1 + 1) 27-1 MG TABS tablet Take 1 tablet  by mouth daily at 12 noon.    Marland Kitchen atenolol (TENORMIN) 25 MG tablet Take 12.5 mg by mouth daily.     Allergies  Allergen Reactions  . Augmentin [Amoxicillin-Pot Clavulanate] Nausea And Vomiting  . Avelox [Moxifloxacin Hcl In Nacl] Nausea And Vomiting  . Biaxin [Clarithromycin] Nausea And Vomiting  . Moxifloxacin Nausea And Vomiting    I have reviewed patient's Past Medical Hx, Surgical Hx, Family Hx, Social Hx, medications and allergies.   Review of Systems  Constitutional: Negative for appetite change, chills and fever.  Gastrointestinal: Positive for abdominal pain. Negative for constipation, diarrhea, nausea and vomiting.  Genitourinary: Positive for flank pain, hematuria (unsure) and vaginal  bleeding (nml lochia). Negative for difficulty urinating, dysuria, frequency and urgency.  Musculoskeletal: Positive for myalgias. Negative for back pain.    OBJECTIVE Patient Vitals for the past 24 hrs:  BP Temp Pulse Resp Height Weight  03/13/17 0129 135/84 97.9 F (36.6 C) 84 16 - -  03/13/17 0029 135/83 98.4 F (36.9 C) 84 18 - -  03/12/17 2218 - 98 F (36.7 C) - 18  (1.753 m) 250 lb (113.4 kg)   Constitutional: Well-developed, well-nourished female in mild distress. Skin: Flushed, warm-hot Cardiovascular: normal rate Respiratory: normal rate and effort.  GI: Abd soft, non-tender. FF, NT, 2/SP MS: Extremities nontender, no edema, normal ROM Neurologic: Alert and oriented x 4.  GU: Pos left CVAT.  SPECULUM EXAM: Declined  LAB RESULTS Results for orders placed or performed during the hospital encounter of 03/12/17 (from the past 24 hour(s))  Urinalysis, Routine w reflex microscopic     Status: Abnormal   Collection Time: 03/12/17 10:27 PM  Result Value Ref Range   Color, Urine YELLOW YELLOW   APPearance HAZY (A) CLEAR   Specific Gravity, Urine 1.013 1.005 - 1.030   pH 5.0 5.0 - 8.0   Glucose, UA NEGATIVE NEGATIVE mg/dL   Hgb urine dipstick MODERATE (A) NEGATIVE   Bilirubin Urine NEGATIVE NEGATIVE   Ketones, ur NEGATIVE NEGATIVE mg/dL   Protein, ur NEGATIVE NEGATIVE mg/dL   Nitrite POSITIVE (A) NEGATIVE   Leukocytes, UA SMALL (A) NEGATIVE   RBC / HPF 6-30 0 - 5 RBC/hpf   WBC, UA TOO NUMEROUS TO COUNT 0 - 5 WBC/hpf   Bacteria, UA MANY (A) NONE SEEN   Squamous Epithelial / LPF 0-5 (A) NONE SEEN   Mucous PRESENT   CBC with Differential/Platelet     Status: Abnormal   Collection Time: 03/13/17 12:01 AM  Result Value Ref Range   WBC 9.0 4.0 - 10.5 K/uL   RBC 3.13 (L) 3.87 - 5.11 MIL/uL   Hemoglobin 9.9 (L) 12.0 - 15.0 g/dL   HCT 81.1 (L) 91.4 - 78.2 %   MCV 93.6 78.0 - 100.0 fL   MCH 31.6 26.0 - 34.0 pg   MCHC 33.8 30.0 - 36.0 g/dL   RDW 95.6 21.3 - 08.6 %    Platelets 172 150 - 400 K/uL   Neutrophils Relative % 74 %   Neutro Abs 6.6 1.7 - 7.7 K/uL   Lymphocytes Relative 21 %   Lymphs Abs 1.9 0.7 - 4.0 K/uL   Monocytes Relative 4 %   Monocytes Absolute 0.4 0.1 - 1.0 K/uL   Eosinophils Relative 1 %   Eosinophils Absolute 0.1 0.0 - 0.7 K/uL   Basophils Relative 0 %   Basophils Absolute 0.0 0.0 - 0.1 K/uL    IMAGING No results found.  MAU COURSE Orders Placed This Encounter  Procedures  . Urine culture  . Urinalysis, Routine w reflex microscopic  . CBC with Differential/Platelet  . Discharge patient   Meds ordered this encounter  Medications  . ibuprofen (ADVIL,MOTRIN) 600 MG tablet    Sig: Take 600 mg by mouth every 6 (six) hours as needed.  Marland Kitchen oxyCODONE-acetaminophen (PERCOCET/ROXICET) 5-325 MG per tablet 1 tablet  . cefTRIAXone (ROCEPHIN) injection 1 g    Order Specific Question:   Antibiotic Indication:    Answer:   UTI    Order Specific Question:   Other Indication:    Answer:   Pyelo  . cephALEXin (KEFLEX) 500 MG capsule    Sig: Take 1 capsule (500 mg total) by mouth 4 (four) times daily.    Dispense:  40 capsule    Refill:  0    Order Specific Question:   Supervising Provider    Answer:   MODY, VAISHALI [[id number]]  . oxyCODONE-acetaminophen (PERCOCET/ROXICET) 5-325 MG tablet    Sig: Take 1-2 tablets by mouth every 6 (six) hours as needed for severe pain.    Dispense:  15 tablet    Refill:  0    Order Specific Question:   Supervising Provider    Answer:   MODY, VAISHALI [[id number]]   Discussed Hx, labs, exam w/ Dr. Juliene Pina. Agrees w/ POC. New orders: Will give Rocephin in MAU and Rx Keflex QID x 10 days.   MDM - Complicated UTI vs mild Pyelo. Tolerating POs and afebrile-candidate for OP Tx.   ASSESSMENT 1. Pyelonephritis     PLAN Discharge home in stable condition per consult w/ Dr. Juliene Pina. Pyelo precautions.  Push fluids, increase rest.  Urine culture pending.  Follow-up Information    Lenoard Aden, MD Follow up.    Specialty:  Obstetrics and Gynecology Why:  as scheduled or sooner as needed if symptoms worsen or do no improve in 2-3 days. Contact information: [address] [place] [address] [phone]        [facility] [place] MATERNITY ADMISSIONS Follow up.   Why:  as needed in emergencies Contact information: [address] [id number] mc [place] [address] [phone]         Allergies as of 03/13/2017      Reactions   Augmentin [amoxicillin-pot Clavulanate] Nausea And Vomiting   Avelox [moxifloxacin Hcl In Nacl] Nausea And Vomiting   Biaxin [clarithromycin] Nausea And Vomiting   Moxifloxacin Nausea And Vomiting      Medication List    STOP taking these medications   atenolol 25 MG tablet Commonly known as:  TENORMIN     TAKE these medications   calcium carbonate 500 MG chewable tablet Commonly known as:  TUMS - dosed in mg elemental calcium Chew 1-2 tablets by mouth daily.   cephALEXin 500 MG capsule Commonly known as:  KEFLEX Take 1 capsule (500 mg total) by mouth 4 (four) times daily.   ibuprofen 600 MG tablet Commonly known as:  ADVIL,MOTRIN Take 600 mg by mouth every 6 (six) hours as needed.   oxyCODONE-acetaminophen 5-325 MG tablet Commonly known as:  PERCOCET/ROXICET Take 1-2 tablets by mouth every 6 (six) hours as needed for severe pain.   prenatal vitamin w/FE, FA 27-1 MG Tabs tablet Take 1 tablet by mouth daily at 12 noon.        [place], CNM 03/13/2017  1:32 AM

## 2017-03-13 NOTE — Lactation Note (Signed)
This note was copied from a baby's chart. Lactation Consultation Note  Patient Name: Autumn Stephens [id number] Date: 03/13/2017 Reason for consult: Follow-up assessment  Baby 77 hours old. Mom reports that baby has had some difficulty latching, and that her breasts feel tight--especially her right breast. Baby has not had a stool in 47 hours. Baby cueing to nurse but mom's breasts too swollen to latch and nipples sore. Syringe-fed baby 10 ml of EBM and assisted mom with icing and hand pumping both breasts. Mom's breast still uncomfortable, but getting a good amount of flow--about an ounce of EBM from both breasts combined. Baby given another 10 ml of EBM with syringe and finger and baby had a stool diaper.   Plan is for mom to continue to ice and express her breasts, keeping them soft. Enc mom to attempt to latch baby again when some of the breast swelling subsides. Enc parents to give baby EBM with syringe if baby not able to latch. Enc feeding with cues and at least every 3 hours, waking if needed, and giving at least 30 ml with each feeding. Mom happy to have milk flowing, getting some relief from breast discomfort, and having baby fed and producing diapers. Enc mom to start latching as soon as she is comfortable enough.   Discussed assessment and interventions with patient's bedside nurse, Alcario Drought, RN.  Maternal Data    Feeding Feeding Type: Breast Milk Length of feed: 10 min  LATCH Score/Interventions Latch: Grasps breast easily, tongue down, lips flanged, rhythmical sucking. Intervention(s): Skin to skin Intervention(s): Adjust position;Assist with latch;Breast massage;Breast compression  Audible Swallowing: Spontaneous and intermittent Intervention(s): Skin to skin;Hand expression  Type of Nipple: Everted at rest and after stimulation  Comfort (Breast/Nipple): Filling, red/small blisters or bruises, mild/mod discomfort  Problem noted: Filling;Mild/Moderate  discomfort Interventions (Filling): Firm support;Massage;Frequent nursing;Double electric pump Interventions  (Cracked/bleeding/bruising/blister): Expressed breast milk to nipple;Double electric pump Interventions (Mild/moderate discomfort): Hand expression  Hold (Positioning): No assistance needed to correctly position infant at breast. Intervention(s): Support Pillows;Skin to skin  LATCH Score: 9  Lactation Tools Discussed/Used     Consult Status Consult Status: Follow-up Date: 03/14/17 Follow-up type: In-patient    Sherlyn Hay 03/13/2017, 8:52 PM

## 2017-03-14 ENCOUNTER — Ambulatory Visit: Payer: Self-pay

## 2017-03-14 NOTE — Lactation Note (Signed)
This note was copied from a baby's chart. Lactation Consultation Note  Patient Name: Autumn Stephens [id number] Date: 03/14/2017 Reason for consult: Follow-up assessment Baby at 94 hr of life and set for D/C today. Mom is reporting bilateral sore nipples. Reviewed nipple care and breast changes. She plans to pump until "later tonight" then she will try to put baby back tot he breast. She is aware of lactation services and support group. She will call as needed.  Mom will pump 8+/24hr. She will latch baby on demand. She will offer supplements per volume guidelines. She is aware of volume increases as baby ages.   Maternal Data    Feeding    LATCH Score/Interventions                      Lactation Tools Discussed/Used     Consult Status Consult Status: Complete    Rulon Eisenmenger 03/14/2017, 12:05 PM

## 2017-03-15 LAB — URINE CULTURE: SPECIAL REQUESTS: NORMAL

## 2017-07-25 ENCOUNTER — Encounter (HOSPITAL_BASED_OUTPATIENT_CLINIC_OR_DEPARTMENT_OTHER): Payer: Self-pay | Admitting: *Deleted

## 2017-07-25 ENCOUNTER — Emergency Department (HOSPITAL_BASED_OUTPATIENT_CLINIC_OR_DEPARTMENT_OTHER)
Admission: EM | Admit: 2017-07-25 | Discharge: 2017-07-25 | Disposition: A | Discharge status: Home / Self Care | Payer: 59 | Attending: Emergency Medicine | Admitting: Emergency Medicine

## 2017-07-25 DIAGNOSIS — R109 Unspecified abdominal pain: Secondary | ICD-10-CM | POA: Diagnosis present

## 2017-07-25 DIAGNOSIS — R1013 Epigastric pain: Secondary | ICD-10-CM

## 2017-07-25 DIAGNOSIS — Z79899 Other long term (current) drug therapy: Secondary | ICD-10-CM | POA: Insufficient documentation

## 2017-07-25 LAB — URINALYSIS, ROUTINE W REFLEX MICROSCOPIC
Glucose, UA: NEGATIVE mg/dL
Hgb urine dipstick: NEGATIVE
Ketones, ur: 40 mg/dL — AB
Leukocytes, UA: NEGATIVE
Nitrite: NEGATIVE
Protein, ur: NEGATIVE mg/dL
Specific Gravity, Urine: 1.025 (ref 1.005–1.030)
pH: 6 (ref 5.0–8.0)

## 2017-07-25 LAB — CBC WITH DIFFERENTIAL/PLATELET
Basophils Absolute: 0 K/uL (ref 0.0–0.1)
Basophils Relative: 0 %
Eosinophils Absolute: 0.1 K/uL (ref 0.0–0.7)
Eosinophils Relative: 1 %
HCT: 41.7 % (ref 36.0–46.0)
Hemoglobin: 14.1 g/dL (ref 12.0–15.0)
Lymphocytes Relative: 29 %
Lymphs Abs: 1.5 K/uL (ref 0.7–4.0)
MCH: 31.2 pg (ref 26.0–34.0)
MCHC: 33.8 g/dL (ref 30.0–36.0)
MCV: 92.3 fL (ref 78.0–100.0)
Monocytes Absolute: 0.4 K/uL (ref 0.1–1.0)
Monocytes Relative: 8 %
Neutro Abs: 3.3 K/uL (ref 1.7–7.7)
Neutrophils Relative %: 62 %
Platelets: 186 K/uL (ref 150–400)
RBC: 4.52 MIL/uL (ref 3.87–5.11)
RDW: 12.9 % (ref 11.5–15.5)
WBC: 5.4 K/uL (ref 4.0–10.5)

## 2017-07-25 LAB — COMPREHENSIVE METABOLIC PANEL
ALBUMIN: 4.7 g/dL (ref 3.5–5.0)
ALT: 27 U/L (ref 14–54)
ANION GAP: 11 (ref 5–15)
AST: 21 U/L (ref 15–41)
Alkaline Phosphatase: 64 U/L (ref 38–126)
BILIRUBIN TOTAL: 0.7 mg/dL (ref 0.3–1.2)
BUN: 16 mg/dL (ref 6–20)
CO2: 24 mmol/L (ref 22–32)
Calcium: 9.6 mg/dL (ref 8.9–10.3)
Chloride: 103 mmol/L (ref 101–111)
Creatinine, Ser: 0.84 mg/dL (ref 0.44–1.00)
GFR calc Af Amer: >60 mL/min (ref >60)
GFR calc non Af Amer: >60 mL/min (ref >60)
GLUCOSE: 91 mg/dL (ref 65–99)
POTASSIUM: 3.7 mmol/L (ref 3.5–5.1)
Sodium: 138 mmol/L (ref 135–145)
TOTAL PROTEIN: 7.7 g/dL (ref 6.5–8.1)

## 2017-07-25 LAB — LIPASE, BLOOD: LIPASE: 34 U/L (ref 11–51)

## 2017-07-25 LAB — PREGNANCY, URINE: PREG TEST UR: NEGATIVE

## 2017-07-25 MED ORDER — PANTOPRAZOLE SODIUM 40 MG IV SOLR
40.0000 mg | Freq: Once | INTRAVENOUS | Status: AC
  Administered 2017-07-25: 40 mg via INTRAVENOUS
  Filled 2017-07-25: qty 40

## 2017-07-25 MED ORDER — SUCRALFATE 1 GM/10ML PO SUSP
1.0000 g | Freq: Once | ORAL | Status: AC
  Administered 2017-07-25: 1 g via ORAL
  Filled 2017-07-25: qty 10

## 2017-07-25 MED ORDER — SUCRALFATE 1 G PO TABS: 1.0000 g | ORAL_TABLET | Freq: Three times a day (TID) | ORAL | 0 refills | Status: DC

## 2017-07-25 MED ORDER — OMEPRAZOLE 40 MG PO CPDR: DELAYED_RELEASE_CAPSULE | ORAL | 0 refills | Status: DC

## 2017-07-25 NOTE — ED Notes (Signed)
Dr. Molpus at [facility] 

## 2017-07-25 NOTE — ED Provider Notes (Signed)
[facility] Provider Note: Autumn Dell, MD, FACEP  CSN: [id number] MRN: [id number] ARRIVAL: 07/25/17 at 0450 ROOM: [facility]06/[facility]   CHIEF COMPLAINT  Abdominal Pain   HISTORY OF PRESENT ILLNESS  07/25/17 5:12 AM Autumn Stephens is a 26 y.o. female who changed her eating habits about 3 weeks ago cutting down on carbohydrates. The past week she has had postprandial epigastric discomfort. This usually occurs about 30 minutes after eating. She describes the discomfort as a gnawing sensation which she rates as 5 out of 10. There is an associated bloating sensation but no nausea, vomiting, diarrhea or constipation. She has tried taking Zantac several times without relief. The discomfort is somewhat worse with palpation or movement. It does not radiate. She has no history of similar pain. She has a history of irritable bowel syndrome but this is different.   Past Medical History:  Diagnosis Date  . Diffuse cystic mastopathy   . Dysrhythmia    tachycardia on atenol last pregnancy  . H/O varicella   . Irritable bowel syndrome   . Maternal anemia complicating pregnancy, childbirth, or the puerperium 09/28/2012  . Migraine, unspecified, without mention of intractable migraine without mention of status migrainosus   . NSVD (normal spontaneous vaginal delivery - 11/5) 09/28/2012  . Other malaise and fatigue   . Perineal laceration, first degree 09/28/2012  . Postpartum care following vaginal delivery 09/28/2012  . Pregnancy induced hypertension   . Pyelonephritis   . Rubella nonimmune status, delivered, current hospitalization 09/28/2012  . Tachycardia     Past Surgical History:  Procedure Laterality Date  . NO PAST SURGERIES      Family History  Problem Relation Age of Onset  . Cancer Mother        skin  . Rheum arthritis Father   . Diabetes Maternal Grandfather   . Cancer Maternal Grandfather        prostate    Social History  Substance Use Topics  . Smoking status: Never  Smoker  . Smokeless tobacco: Never Used  . Alcohol use No    Prior to Admission medications   Medication Sig Start Date End Date Taking? Authorizing Provider  norethindrone (CAMILA) 0.35 MG tablet Take 1 tablet by mouth daily.   Yes [provider]  calcium carbonate (TUMS - DOSED IN MG ELEMENTAL CALCIUM) 500 MG chewable tablet Chew 1-2 tablets by mouth daily.    [provider]  cephALEXin (KEFLEX) 500 MG capsule Take 1 capsule (500 mg total) by mouth 4 (four) times daily. 03/13/17   Katrinka Blazing, [place], CNM  ibuprofen (ADVIL,MOTRIN) 600 MG tablet Take 600 mg by mouth every 6 (six) hours as needed.    [provider]  oxyCODONE-acetaminophen (PERCOCET/ROXICET) 5-325 MG tablet Take 1-2 tablets by mouth every 6 (six) hours as needed for severe pain. 03/13/17   Katrinka Blazing, [place], CNM  prenatal vitamin w/FE, FA (PRENATAL 1 + 1) 27-1 MG TABS tablet Take 1 tablet by mouth daily at 12 noon.    [provider]    Allergies Augmentin [amoxicillin-pot clavulanate]; Avelox [moxifloxacin hcl in nacl]; Biaxin [clarithromycin]; and Moxifloxacin   REVIEW OF SYSTEMS  Negative except as noted here or in the History of Present Illness.   PHYSICAL EXAMINATION  Initial Vital Signs currently breastfeeding.  Examination General: Well-developed, well-nourished female in no acute distress; appearance consistent with age of record HENT: normocephalic; atraumatic Eyes: pupils equal, round and reactive to light; extraocular muscles intact Neck: supple Heart: regular rate and rhythm Lungs:  clear to auscultation bilaterally Abdomen: soft; nondistended; mild epigastric tenderness; no masses or hepatosplenomegaly; bowel sounds present Extremities: No deformity; full range of motion; pulses normal Neurologic: Awake, alert and oriented; motor function intact in all extremities and symmetric; no facial droop Skin: Warm and dry Psychiatric: Normal mood and affect   RESULTS    Summary of this visit's results, reviewed by myself:   EKG Interpretation  Date/Time:    Ventricular Rate:    PR Interval:    QRS Duration:   QT Interval:    QTC Calculation:   R Axis:     Text Interpretation:        Laboratory Studies: Results for orders placed or performed during the hospital encounter of 07/25/17 (from the past 24 hour(s))  Urinalysis, Routine w reflex microscopic     Status: Abnormal   Collection Time: 07/25/17  4:50 AM  Result Value Ref Range   Color, Urine YELLOW YELLOW   APPearance CLOUDY (A) CLEAR   Specific Gravity, Urine 1.025 1.005 - 1.030   pH 6.0 5.0 - 8.0   Glucose, UA NEGATIVE NEGATIVE mg/dL   Hgb urine dipstick NEGATIVE NEGATIVE   Bilirubin Urine SMALL (A) NEGATIVE   Ketones, ur 40 (A) NEGATIVE mg/dL   Protein, ur NEGATIVE NEGATIVE mg/dL   Nitrite NEGATIVE NEGATIVE   Leukocytes, UA NEGATIVE NEGATIVE  Pregnancy, urine     Status: None   Collection Time: 07/25/17  4:50 AM  Result Value Ref Range   Preg Test, Ur NEGATIVE NEGATIVE  CBC with Differential/Platelet     Status: None   Collection Time: 07/25/17  5:55 AM  Result Value Ref Range   WBC 5.4 4.0 - 10.5 K/uL   RBC 4.52 3.87 - 5.11 MIL/uL   Hemoglobin 14.1 12.0 - 15.0 g/dL   HCT 40.9 81.1 - 91.4 %   MCV 92.3 78.0 - 100.0 fL   MCH 31.2 26.0 - 34.0 pg   MCHC 33.8 30.0 - 36.0 g/dL   RDW 78.2 95.6 - 21.3 %   Platelets 186 150 - 400 K/uL   Neutrophils Relative % 62 %   Neutro Abs 3.3 1.7 - 7.7 K/uL   Lymphocytes Relative 29 %   Lymphs Abs 1.5 0.7 - 4.0 K/uL   Monocytes Relative 8 %   Monocytes Absolute 0.4 0.1 - 1.0 K/uL   Eosinophils Relative 1 %   Eosinophils Absolute 0.1 0.0 - 0.7 K/uL   Basophils Relative 0 %   Basophils Absolute 0.0 0.0 - 0.1 K/uL  Comprehensive metabolic panel     Status: None   Collection Time: 07/25/17  5:55 AM  Result Value Ref Range   Sodium 138 135 - 145 mmol/L   Potassium 3.7 3.5 - 5.1 mmol/L   Chloride 103 101 - 111 mmol/L   CO2 24 22 - 32  mmol/L   Glucose, Bld 91 65 - 99 mg/dL   BUN 16 6 - 20 mg/dL   Creatinine, Ser 0.86 0.44 - 1.00 mg/dL   Calcium 9.6 8.9 - 57.8 mg/dL   Total Protein 7.7 6.5 - 8.1 g/dL   Albumin 4.7 3.5 - 5.0 g/dL   AST 21 15 - 41 U/L   ALT 27 14 - 54 U/L   Alkaline Phosphatase 64 38 - 126 U/L   Total Bilirubin 0.7 0.3 - 1.2 mg/dL   GFR calc non Af Amer >60 >60 mL/min   GFR calc Af Amer >60 >60 mL/min   Anion gap 11 5 - 15  Lipase, blood     Status: None   Collection Time: 07/25/17  5:55 AM  Result Value Ref Range   Lipase 34 11 - 51 U/L   Imaging Studies: No results found.  ED COURSE  Nursing notes and initial vitals signs, including pulse oximetry, reviewed.  Vitals:   07/25/17 0513 07/25/17 0617  BP: 121/82 124/86  Pulse: 73 71  Resp: 18 16  Temp: 97.7 F (36.5 C)   TempSrc: Oral   SpO2: 99% 96%  Weight: 93 kg (205 lb)   Height: 5\' 10"  (1.778 m)    6:33 AM Patient given IV Protonix and oral Carafate in the ED. History is consistent with gastritis or early peptic ulcer disease. We will place her on a course of a PPI and Carafate and refer to gastroenterology if she is not improving.  PROCEDURES    ED DIAGNOSES     ICD-10-CM   1. Epigastric pain R10.13        Dorsie Sethi, Jonny RuizJohn, MD 07/25/17 [phone]

## 2017-07-25 NOTE — ED Notes (Signed)
Alert, NAD, calm, interactive, resps e/u, speaking in clear complete sentences, no dyspnea noted, skin W&D, VSS, c/o gnawing mid upper abd pain, (denies: fever, sob, NVD, constipation, bleeding, back pain, urinary or vaginal sx, dizziness or visual changes). Sxs on and off for 1 week, worse and constant after eating last night at 2000. No relief with zantac trial. Started diet med 3 weeks ago. Last BM yesterday (normal). EDP into room.

## 2018-09-26 ENCOUNTER — Other Ambulatory Visit: Payer: Self-pay

## 2018-09-26 ENCOUNTER — Emergency Department (HOSPITAL_BASED_OUTPATIENT_CLINIC_OR_DEPARTMENT_OTHER)
Admission: EM | Admit: 2018-09-26 | Discharge: 2018-09-26 | Disposition: A | Discharge status: Home / Self Care | Payer: 59 | Attending: Emergency Medicine | Admitting: Emergency Medicine

## 2018-09-26 DIAGNOSIS — K529 Noninfective gastroenteritis and colitis, unspecified: Secondary | ICD-10-CM | POA: Diagnosis not present

## 2018-09-26 DIAGNOSIS — Z79899 Other long term (current) drug therapy: Secondary | ICD-10-CM | POA: Insufficient documentation

## 2018-09-26 DIAGNOSIS — R109 Unspecified abdominal pain: Secondary | ICD-10-CM | POA: Diagnosis present

## 2018-09-26 LAB — LIPASE, BLOOD: Lipase: 34 U/L (ref 11–51)

## 2018-09-26 LAB — COMPREHENSIVE METABOLIC PANEL
ALBUMIN: 4.1 g/dL (ref 3.5–5.0)
ALK PHOS: 42 U/L (ref 38–126)
ALT: 15 U/L (ref 0–44)
ANION GAP: 12 (ref 5–15)
AST: 20 U/L (ref 15–41)
BILIRUBIN TOTAL: 0.6 mg/dL (ref 0.3–1.2)
BUN: 11 mg/dL (ref 6–20)
CALCIUM: 8.8 mg/dL — AB (ref 8.9–10.3)
CO2: 22 mmol/L (ref 22–32)
CREATININE: 0.8 mg/dL (ref 0.44–1.00)
Chloride: 104 mmol/L (ref 98–111)
GFR calc Af Amer: >60 mL/min (ref >60)
GFR calc non Af Amer: >60 mL/min (ref >60)
Glucose, Bld: 103 mg/dL — ABNORMAL HIGH (ref 70–99)
Potassium: 3.7 mmol/L (ref 3.5–5.1)
Sodium: 138 mmol/L (ref 135–145)
TOTAL PROTEIN: 7.6 g/dL (ref 6.5–8.1)

## 2018-09-26 LAB — CBC
HCT: 45 % (ref 36.0–46.0)
Hemoglobin: 14.9 g/dL (ref 12.0–15.0)
MCH: 31.2 pg (ref 26.0–34.0)
MCHC: 33.1 g/dL (ref 30.0–36.0)
MCV: 94.1 fL (ref 80.0–100.0)
PLATELETS: 175 10*3/uL (ref 150–400)
RBC: 4.78 MIL/uL (ref 3.87–5.11)
RDW: 12.5 % (ref 11.5–15.5)
WBC: 6.8 10*3/uL (ref 4.0–10.5)
nRBC: 0 % (ref 0.0–0.2)

## 2018-09-26 MED ORDER — SODIUM CHLORIDE 0.9 % IV BOLUS
500.0000 mL | Freq: Once | INTRAVENOUS | Status: AC
  Administered 2018-09-26: 500 mL via INTRAVENOUS

## 2018-09-26 MED ORDER — ONDANSETRON HCL 4 MG/2ML IJ SOLN
4.0000 mg | Freq: Once | INTRAMUSCULAR | Status: AC | PRN
  Administered 2018-09-26: 4 mg via INTRAVENOUS
  Filled 2018-09-26: qty 2

## 2018-09-26 MED ORDER — SODIUM CHLORIDE 0.9 % IV BOLUS
1000.0000 mL | Freq: Once | INTRAVENOUS | Status: AC
  Administered 2018-09-26: 1000 mL via INTRAVENOUS

## 2018-09-26 NOTE — ED Provider Notes (Signed)
[facility] EMERGENCY DEPARTMENT Provider Note  CSN: [id number] Arrival date & time: 09/26/18 0154  Chief Complaint(s) Abdominal Pain and Diarrhea  HPI Autumn Stephens is a 27 y.o. female   The history is provided by the patient.  Abdominal Cramping  This is a new problem. The current episode started yesterday. Episode frequency: intermittent. Progression since onset: fluctuating. Pertinent negatives include no chest pain, no headaches and no shortness of breath. The symptoms are aggravated by walking. Treatments tried: immodium.  Diarrhea   This is a new problem. The current episode started yesterday. The problem occurs more than 10 times per day. The problem has not changed since onset.The stool consistency is described as watery. Associated symptoms include chills. Pertinent negatives include no vomiting, no headaches, no URI and no cough. She has tried anti-motility drugs for the symptoms. The treatment provided no relief. Risk factors include suspect food intake.    Past Medical History Past Medical History:  Diagnosis Date  . Diffuse cystic mastopathy   . Dysrhythmia    tachycardia on atenol last pregnancy  . H/O varicella   . Irritable bowel syndrome   . Maternal anemia complicating pregnancy, childbirth, or the puerperium 09/28/2012  . Migraine, unspecified, without mention of intractable migraine without mention of status migrainosus   . NSVD (normal spontaneous vaginal delivery - 11/5) 09/28/2012  . Other malaise and fatigue   . Perineal laceration, first degree 09/28/2012  . Postpartum care following vaginal delivery 09/28/2012  . Pregnancy induced hypertension   . Pyelonephritis   . Rubella nonimmune status, delivered, current hospitalization 09/28/2012  . Tachycardia    Patient Active Problem List   Diagnosis Date Noted  . Postpartum care following vaginal delivery (4/18) 03/11/2017  . Shoulder dystocia, delivered 03/11/2017  . Second-degree perineal  laceration, with delivery 03/11/2017  . Current use of beta blocker 03/10/2017   Home Medication(s) Prior to Admission medications   Medication Sig Start Date End Date Taking? Authorizing Provider  calcium carbonate (TUMS - DOSED IN MG ELEMENTAL CALCIUM) 500 MG chewable tablet Chew 1-2 tablets by mouth daily.    [provider]  norethindrone (CAMILA) 0.35 MG tablet Take 1 tablet by mouth daily.    [provider]  omeprazole (PRILOSEC) 40 MG capsule Take one capsule every morning at least 30 minutes before first dose of Carafate. 07/25/17   Molpus, John, MD  prenatal vitamin w/FE, FA (PRENATAL 1 + 1) 27-1 MG TABS tablet Take 1 tablet by mouth daily at 12 noon.    [provider]  sucralfate (CARAFATE) 1 g tablet Take 1 tablet (1 g total) by mouth 4 (four) times daily -  with meals and at bedtime. 07/25/17   Molpus, Jonny Ruiz, MD                                                                                                                                    Past Surgical  History Past Surgical History:  Procedure Laterality Date  . NO PAST SURGERIES     Family History Family History  Problem Relation Age of Onset  . Cancer Mother        skin  . Rheum arthritis Father   . Diabetes Maternal Grandfather   . Cancer Maternal Grandfather        prostate    Social History Social History   Tobacco Use  . Smoking status: Never Smoker  . Smokeless tobacco: Never Used  Substance Use Topics  . Alcohol use: No  . Drug use: No   Allergies Augmentin [amoxicillin-pot clavulanate]; Avelox [moxifloxacin hcl in nacl]; Biaxin [clarithromycin]; and Moxifloxacin  Review of Systems Review of Systems  Constitutional: Positive for chills.  Respiratory: Negative for cough and shortness of breath.   Cardiovascular: Negative for chest pain.  Gastrointestinal: Positive for diarrhea. Negative for vomiting.  Neurological: Negative for headaches.   All other systems are reviewed  and are negative for acute change except as noted in the HPI  Physical Exam Vital Signs  I have reviewed the triage vital signs BP 116/76   Pulse 100   Temp 98 F (36.7 C)   Resp 18   Ht 5\' 10"  (1.778 m)   Wt 89.8 kg   SpO2 97%   BMI 28.41 kg/m   Physical Exam  Constitutional: She is oriented to person, place, and time. She appears well-developed and well-nourished. No distress.  HENT:  Head: Normocephalic and atraumatic.  Right Ear: External ear normal.  Left Ear: External ear normal.  Nose: Nose normal.  Eyes: Conjunctivae and EOM are normal. No scleral icterus.  Neck: Normal range of motion and phonation normal.  Cardiovascular: Normal rate and regular rhythm.  Pulmonary/Chest: Effort normal. No stridor. No respiratory distress.  Abdominal: She exhibits no distension. There is no tenderness. There is no rigidity, no rebound and no guarding.    Musculoskeletal: Normal range of motion. She exhibits no edema.  Neurological: She is alert and oriented to person, place, and time.  Skin: She is not diaphoretic.  Psychiatric: She has a normal mood and affect. Her behavior is normal.  Vitals reviewed.   ED Results and Treatments Labs (all labs ordered are listed, but only abnormal results are displayed) Labs Reviewed  COMPREHENSIVE METABOLIC PANEL - Abnormal; Notable for the following components:      Result Value   Glucose, Bld 103 (*)    Calcium 8.8 (*)    All other components within normal limits  LIPASE, BLOOD  CBC  URINALYSIS, ROUTINE W REFLEX MICROSCOPIC  PREGNANCY, URINE                                                                                                                         EKG  EKG Interpretation  Date/Time:    Ventricular Rate:    PR Interval:    QRS Duration:   QT Interval:    QTC Calculation:   R Axis:  Text Interpretation:        Radiology No results found. Pertinent labs & imaging results that were available during my care of  the patient were reviewed by me and considered in my medical decision making (see chart for details).  Medications Ordered in ED Medications  sodium chloride 0.9 % bolus 500 mL (500 mLs Intravenous New Bag/Given 09/26/18 0411)  ondansetron (ZOFRAN) injection 4 mg (4 mg Intravenous Given 09/26/18 0218)  sodium chloride 0.9 % bolus 1,000 mL (0 mLs Intravenous Stopped 09/26/18 0342)                                                                                                                                    Procedures Procedures  (including critical care time)  Medical Decision Making / ED Course I have reviewed the nursing notes for this encounter and the patient's prior records (if available in EHR or on provided paperwork).    27 y.o. female presents with vomiting, diarrhea, and fever for 2 days. Possible suspicious food intake. .normal oral tolerance. Rest of history as above.  Patient appears well, not in distress, and with no signs of toxicity or dehydration. Abdomen benign.  Rest of the exam as above  Labs grossly reassuring without leukocytosis or anemia.  No significant electrolyte derangements or renal insufficiency.  No evidence of biliary obstruction or pancreatitis.  Most consistent with viral gastroenteritis vs food poisoning.   Doubt appendicitis, diverticulitis, severe colitis, dysentery.    Able to tolerate oral intake in the ED.  Discussed symptomatic treatment with the patient and they will follow closely with their PCP.   Final Clinical Impression(s) / ED Diagnoses Final diagnoses:  Gastroenteritis   Disposition: Discharge  Condition: Good  I have discussed the results, Dx and Tx plan with the patient who expressed understanding and agree(s) with the plan. Discharge instructions discussed at great length. The patient was given strict return precautions who verbalized understanding of the instructions. No further questions at time of discharge.    ED  Discharge Orders    None       Follow Up: Primary care provider  Schedule an appointment as soon as possible for a visit  in 5-7 days, If symptoms do not improve or  worsen      This chart was dictated using voice recognition software.  Despite best efforts to proofread,  errors can occur which can change the documentation meaning.   Nira Conn, MD 09/26/18 0500

## 2018-09-26 NOTE — ED Triage Notes (Signed)
Pt states that she has had diarrhea since Saturday. States that she has tried zofran, promethazine, and imodium without relief. Stated she had fever Saturday too. Any intake goes straight through her.

## 2018-09-26 NOTE — ED Notes (Signed)
Pt understood dc material. NAD noted. 

## 2018-09-26 NOTE — ED Notes (Signed)
Bedside commode placed in room. Pt attempted to give a urine sample but was unsuccessful.

## 2019-04-15 ENCOUNTER — Other Ambulatory Visit: Payer: Self-pay

## 2019-04-15 ENCOUNTER — Encounter (HOSPITAL_BASED_OUTPATIENT_CLINIC_OR_DEPARTMENT_OTHER): Payer: Self-pay | Admitting: Emergency Medicine

## 2019-04-15 ENCOUNTER — Emergency Department (HOSPITAL_BASED_OUTPATIENT_CLINIC_OR_DEPARTMENT_OTHER): Payer: 59

## 2019-04-15 ENCOUNTER — Other Ambulatory Visit: Payer: Self-pay | Admitting: General Surgery

## 2019-04-15 ENCOUNTER — Observation Stay (HOSPITAL_BASED_OUTPATIENT_CLINIC_OR_DEPARTMENT_OTHER)
Admission: EM | Admit: 2019-04-15 | Discharge: 2019-04-17 | Disposition: A | Discharge status: Home / Self Care | Payer: 59 | Attending: General Surgery | Admitting: General Surgery

## 2019-04-15 DIAGNOSIS — K8010 Calculus of gallbladder with chronic cholecystitis without obstruction: Secondary | ICD-10-CM | POA: Diagnosis present

## 2019-04-15 DIAGNOSIS — G43909 Migraine, unspecified, not intractable, without status migrainosus: Secondary | ICD-10-CM | POA: Insufficient documentation

## 2019-04-15 DIAGNOSIS — Z1159 Encounter for screening for other viral diseases: Secondary | ICD-10-CM | POA: Diagnosis not present

## 2019-04-15 DIAGNOSIS — K589 Irritable bowel syndrome without diarrhea: Secondary | ICD-10-CM | POA: Diagnosis not present

## 2019-04-15 DIAGNOSIS — R1011 Right upper quadrant pain: Secondary | ICD-10-CM

## 2019-04-15 DIAGNOSIS — I10 Essential (primary) hypertension: Secondary | ICD-10-CM | POA: Insufficient documentation

## 2019-04-15 DIAGNOSIS — K819 Cholecystitis, unspecified: Secondary | ICD-10-CM | POA: Diagnosis present

## 2019-04-15 DIAGNOSIS — R000 Tachycardia, unspecified: Secondary | ICD-10-CM | POA: Insufficient documentation

## 2019-04-15 DIAGNOSIS — K8020 Calculus of gallbladder without cholecystitis without obstruction: Secondary | ICD-10-CM | POA: Diagnosis present

## 2019-04-15 DIAGNOSIS — K810 Acute cholecystitis: Secondary | ICD-10-CM

## 2019-04-15 LAB — CBC
HCT: 43.7 % (ref 36.0–46.0)
Hemoglobin: 14.5 g/dL (ref 12.0–15.0)
MCH: 31.9 pg (ref 26.0–34.0)
MCHC: 33.2 g/dL (ref 30.0–36.0)
MCV: 96.3 fL (ref 80.0–100.0)
Platelets: 202 10*3/uL (ref 150–400)
RBC: 4.54 MIL/uL (ref 3.87–5.11)
RDW: 12.3 % (ref 11.5–15.5)
WBC: 8.3 10*3/uL (ref 4.0–10.5)
nRBC: 0 % (ref 0.0–0.2)

## 2019-04-15 LAB — URINALYSIS, ROUTINE W REFLEX MICROSCOPIC
Bilirubin Urine: NEGATIVE
Glucose, UA: NEGATIVE mg/dL
Hgb urine dipstick: NEGATIVE
Ketones, ur: NEGATIVE mg/dL
Leukocytes,Ua: NEGATIVE
Nitrite: NEGATIVE
Protein, ur: NEGATIVE mg/dL
Specific Gravity, Urine: >1.03 — ABNORMAL HIGH (ref 1.005–1.030)
pH: 6 (ref 5.0–8.0)

## 2019-04-15 LAB — PREGNANCY, URINE: Preg Test, Ur: NEGATIVE

## 2019-04-15 LAB — COMPREHENSIVE METABOLIC PANEL
ALT: 91 U/L — ABNORMAL HIGH (ref 0–44)
AST: 23 U/L (ref 15–41)
Albumin: 4 g/dL (ref 3.5–5.0)
Alkaline Phosphatase: 67 U/L (ref 38–126)
Anion gap: 8 (ref 5–15)
BUN: 14 mg/dL (ref 6–20)
CO2: 25 mmol/L (ref 22–32)
Calcium: 9.1 mg/dL (ref 8.9–10.3)
Chloride: 105 mmol/L (ref 98–111)
Creatinine, Ser: 0.84 mg/dL (ref 0.44–1.00)
GFR calc Af Amer: >60 mL/min (ref >60)
GFR calc non Af Amer: >60 mL/min (ref >60)
Glucose, Bld: 99 mg/dL (ref 70–99)
Potassium: 3.9 mmol/L (ref 3.5–5.1)
Sodium: 138 mmol/L (ref 135–145)
Total Bilirubin: 0.6 mg/dL (ref 0.3–1.2)
Total Protein: 7.2 g/dL (ref 6.5–8.1)

## 2019-04-15 LAB — LIPASE, BLOOD: Lipase: 34 U/L (ref 11–51)

## 2019-04-15 LAB — SARS CORONAVIRUS 2 AG (30 MIN TAT): SARS Coronavirus 2 Ag: NEGATIVE

## 2019-04-15 MED ORDER — FENTANYL CITRATE (PF) 100 MCG/2ML IJ SOLN
INTRAMUSCULAR | Status: AC
  Filled 2019-04-15: qty 2

## 2019-04-15 MED ORDER — ONDANSETRON 4 MG PO TBDP: 4.0000 mg | ORAL_TABLET | Freq: Four times a day (QID) | ORAL | Status: DC | PRN

## 2019-04-15 MED ORDER — ONDANSETRON HCL 4 MG/2ML IJ SOLN
4.0000 mg | Freq: Once | INTRAMUSCULAR | Status: AC
  Administered 2019-04-15: 4 mg via INTRAVENOUS
  Filled 2019-04-15: qty 2

## 2019-04-15 MED ORDER — SODIUM CHLORIDE 0.9 % IV SOLN
2.0000 g | Freq: Once | INTRAVENOUS | Status: AC
  Administered 2019-04-15: 2 g via INTRAVENOUS
  Filled 2019-04-15: qty 20

## 2019-04-15 MED ORDER — DIPHENHYDRAMINE HCL 50 MG/ML IJ SOLN: 25.0000 mg | Freq: Four times a day (QID) | INTRAMUSCULAR | Status: DC | PRN

## 2019-04-15 MED ORDER — HYDROMORPHONE HCL 1 MG/ML IJ SOLN
0.5000 mg | Freq: Once | INTRAMUSCULAR | Status: AC
  Administered 2019-04-15: 0.5 mg via INTRAVENOUS
  Filled 2019-04-15: qty 1

## 2019-04-15 MED ORDER — HYDROCODONE-ACETAMINOPHEN 5-325 MG PO TABS
1.0000 | ORAL_TABLET | ORAL | Status: DC | PRN
  Administered 2019-04-15: 1 via ORAL
  Filled 2019-04-15: qty 1

## 2019-04-15 MED ORDER — SODIUM CHLORIDE 0.9 % IV SOLN
INTRAVENOUS | Status: DC | PRN
  Administered 2019-04-15 – 2019-04-17 (×4): via INTRAVENOUS

## 2019-04-15 MED ORDER — FENTANYL CITRATE (PF) 100 MCG/2ML IJ SOLN
50.0000 ug | Freq: Once | INTRAMUSCULAR | Status: AC
  Administered 2019-04-15: 50 ug via INTRAVENOUS
  Filled 2019-04-15: qty 2

## 2019-04-15 MED ORDER — SODIUM CHLORIDE 0.9 % IV SOLN
2.0000 g | INTRAVENOUS | Status: DC
  Administered 2019-04-16: 2 g via INTRAVENOUS
  Filled 2019-04-15: qty 2
  Filled 2019-04-15: qty 20

## 2019-04-15 MED ORDER — DOCUSATE SODIUM 100 MG PO CAPS
100.0000 mg | ORAL_CAPSULE | Freq: Two times a day (BID) | ORAL | Status: DC
  Administered 2019-04-15 – 2019-04-16 (×3): 100 mg via ORAL
  Filled 2019-04-15 (×3): qty 1

## 2019-04-15 MED ORDER — SODIUM CHLORIDE 0.9 % IV SOLN
INTRAVENOUS | Status: DC | PRN
  Administered 2019-04-15: 500 mL via INTRAVENOUS

## 2019-04-15 MED ORDER — LACTATED RINGERS IV SOLN
INTRAVENOUS | Status: AC
  Administered 2019-04-15: 14:00:00 via INTRAVENOUS

## 2019-04-15 MED ORDER — DIPHENHYDRAMINE HCL 25 MG PO CAPS: 25.0000 mg | ORAL_CAPSULE | Freq: Four times a day (QID) | ORAL | Status: DC | PRN

## 2019-04-15 MED ORDER — SODIUM CHLORIDE 0.9 % IV BOLUS
1000.0000 mL | Freq: Once | INTRAVENOUS | Status: AC
  Administered 2019-04-15: 1000 mL via INTRAVENOUS

## 2019-04-15 MED ORDER — ENOXAPARIN SODIUM 40 MG/0.4ML ~~LOC~~ SOLN
40.0000 mg | SUBCUTANEOUS | Status: DC
  Administered 2019-04-15 – 2019-04-16 (×2): 40 mg via SUBCUTANEOUS
  Filled 2019-04-15 (×2): qty 0.4

## 2019-04-15 MED ORDER — MORPHINE SULFATE (PF) 2 MG/ML IV SOLN
2.0000 mg | INTRAVENOUS | Status: DC | PRN
  Administered 2019-04-16 (×2): 2 mg via INTRAVENOUS
  Filled 2019-04-15: qty 1
  Filled 2019-04-15: qty 2
  Filled 2019-04-15: qty 1

## 2019-04-15 MED ORDER — IOHEXOL 300 MG/ML  SOLN
100.0000 mL | Freq: Once | INTRAMUSCULAR | Status: AC
  Administered 2019-04-15: 05:00:00 100 mL via INTRAVENOUS

## 2019-04-15 MED ORDER — ONDANSETRON HCL 4 MG/2ML IJ SOLN
4.0000 mg | Freq: Four times a day (QID) | INTRAMUSCULAR | Status: DC | PRN
  Administered 2019-04-15: 4 mg via INTRAVENOUS
  Filled 2019-04-15: qty 2

## 2019-04-15 NOTE — H&P (Signed)
Autumn Stephens is an 28 y.o. female.   Chief Complaint: RUQ pain  HPI: Patient c/o ruq pain for the past 5 days. States symptoms occur at rest, moderate-severe, waxing/waning in intensity,, non radiating. Denies specific exacerbating or alleviating factors. No specific change w eating or certain foods. No change with activity level or position. Not pleuritic. Denies strain or injury to area. No hx same symptoms in past. No hx gallstones. Denies back or flank pain. No dysuria or hematuria. Intermittent nausea. No vomiting.  Past Medical History:  Diagnosis Date  . Diffuse cystic mastopathy   . Dysrhythmia    tachycardia on atenol last pregnancy  . H/O varicella   . Irritable bowel syndrome   . Maternal anemia complicating pregnancy, childbirth, or the puerperium 09/28/2012  . Migraine, unspecified, without mention of intractable migraine without mention of status migrainosus   . NSVD (normal spontaneous vaginal delivery - 11/5) 09/28/2012  . Other malaise and fatigue   . Perineal laceration, first degree 09/28/2012  . Postpartum care following vaginal delivery 09/28/2012  . Pregnancy induced hypertension   . Pyelonephritis   . Rubella nonimmune status, delivered, current hospitalization 09/28/2012  . Tachycardia     Past Surgical History:  Procedure Laterality Date  . NO PAST SURGERIES      Family History  Problem Relation Age of Onset  . Cancer Mother        skin  . Rheum arthritis Father   . Diabetes Maternal Grandfather   . Cancer Maternal Grandfather        prostate   Social History:  reports that she has never smoked. She has never used smokeless tobacco. She reports that she does not drink alcohol or use drugs.  Allergies:  Allergies  Allergen Reactions  . Augmentin [Amoxicillin-Pot Clavulanate] Nausea And Vomiting  . Avelox [Moxifloxacin Hcl In Nacl] Nausea And Vomiting  . Biaxin [Clarithromycin] Nausea And Vomiting  . Moxifloxacin Nausea And Vomiting    (Not in a  hospital admission)   Results for orders placed or performed during the hospital encounter of 04/15/19 (from the past 48 hour(s))  CBC     Status: None   Collection Time: 04/15/19  3:47 AM  Result Value Ref Range   WBC 8.3 4.0 - 10.5 K/uL   RBC 4.54 3.87 - 5.11 MIL/uL   Hemoglobin 14.5 12.0 - 15.0 g/dL   HCT 16.1 09.6 - 04.5 %   MCV 96.3 80.0 - 100.0 fL   MCH 31.9 26.0 - 34.0 pg   MCHC 33.2 30.0 - 36.0 g/dL   RDW 40.9 81.1 - 91.4 %   Platelets 202 150 - 400 K/uL   nRBC 0.0 0.0 - 0.2 %    Comment: Performed at [facility], [address] [place], [place] [address]  Comprehensive metabolic panel     Status: Abnormal   Collection Time: 04/15/19  3:47 AM  Result Value Ref Range   Sodium 138 135 - 145 mmol/L   Potassium 3.9 3.5 - 5.1 mmol/L   Chloride 105 98 - 111 mmol/L   CO2 25 22 - 32 mmol/L   Glucose, Bld 99 70 - 99 mg/dL   BUN 14 6 - 20 mg/dL   Creatinine, Ser 6.21 0.44 - 1.00 mg/dL   Calcium 9.1 8.9 - 30.8 mg/dL   Total Protein 7.2 6.5 - 8.1 g/dL   Albumin 4.0 3.5 - 5.0 g/dL   AST 23 15 - 41 U/L   ALT 91 (H) 0 -  44 U/L   Alkaline Phosphatase 67 38 - 126 U/L   Total Bilirubin 0.6 0.3 - 1.2 mg/dL   GFR calc non Af Amer >60 >60 mL/min   GFR calc Af Amer >60 >60 mL/min   Anion gap 8 5 - 15    Comment: Performed at [facility], [address] [place], [place] [address]  Lipase, blood     Status: None   Collection Time: 04/15/19  3:47 AM  Result Value Ref Range   Lipase 34 11 - 51 U/L    Comment: Performed at [facility], [address] [place], [place] [address]  Urinalysis, Routine w reflex microscopic     Status: Abnormal   Collection Time: 04/15/19  4:07 AM  Result Value Ref Range   Color, Urine YELLOW YELLOW   APPearance HAZY (A) CLEAR   Specific Gravity, Urine >1.030 (H) 1.005 - 1.030   pH 6.0 5.0 - 8.0   Glucose, UA NEGATIVE NEGATIVE mg/dL   Hgb urine dipstick NEGATIVE NEGATIVE   Bilirubin Urine NEGATIVE NEGATIVE    Ketones, ur NEGATIVE NEGATIVE mg/dL   Protein, ur NEGATIVE NEGATIVE mg/dL   Nitrite NEGATIVE NEGATIVE   Leukocytes,Ua NEGATIVE NEGATIVE    Comment: Microscopic not done on urines with negative protein, blood, leukocytes, nitrite, or glucose < 500 mg/dL. Performed at [facility], [address] [place], [place] [address]   Pregnancy, urine     Status: None   Collection Time: 04/15/19  4:07 AM  Result Value Ref Range   Preg Test, Ur NEGATIVE NEGATIVE    Comment:        THE SENSITIVITY OF THIS METHODOLOGY IS >20 mIU/mL. Performed at [facility], [address] [place], [place] [address]   SARS Coronavirus 2 (Hosp order,Performed in [facility] lab via [employer])     Status: None   Collection Time: 04/15/19 10:18 AM  Result Value Ref Range   SARS Coronavirus 2 ([employer] Now) NEGATIVE NEGATIVE    Comment: (NOTE) Interpretive Result Comment(s): COVID 19 Positive SARS CoV 2 target nucleic acids are DETECTED. The SARS CoV 2 RNA is generally detectable in upper and lower respiratory specimens during the acute phase of infection.  Positive results are indicative of active infection with SARS CoV 2.  Clinical correlation with patient history and other diagnostic information is necessary to determine patient infection status.  Positive results do not rule out bacterial infection or coinfection with other viruses. The expected result is Negative. COVID 19 Negative SARS CoV 2 target nucleic acids are NOT DETECTED. The SARS CoV 2 RNA is generally detectable in upper and lower respiratory specimens during the acute phase of infection.  Negative results do not preclude SARS CoV 2 infection, do not rule out coinfections with other pathogens, and should not be used as the sole basis for treatment or other patient management decisions.  Negative results must be combined with clinical  observations, patient history, and epidemiological information. The expected result  is Negative. Invalid Presence or absence of SARS CoV 2 nucleic acids cannot be determined. Repeat testing was performed on the submitted specimen and repeated Invalid results were obtained.  If clinically indicated, additional testing on a new specimen with an alternate test methodology [phone]) is advised.  The SARS CoV 2 RNA is generally detectable in upper and lower respiratory specimens during the acute phase of infection. The expected result is Negative. Fact Sheet for Patients:  http://www.graves-ford.org/https://www.fda.gov/media/136524/download Fact Sheet for Healthcare Providers: [employer]Con[employer].sihttps://www.fda.gov/media/136523/download This test is not yet approved or cleared by the [place] FDA and has been authorized for detection and/or diagnosis of SARS CoV 2 by FDA under an Emergency Use Authorization (EUA).  This EUA will remain in effect (meaning this test can be used) for the duration of the COVID19 d eclaration under Section 564(b)(1) of the Act, 21 U.S.C. section [id number] 3(b)(1), unless the authorization is terminated or revoked sooner. Performed at [facility], [address] [place], [place] [id number]    Ct Abdomen Pelvis W Contrast  Result Date: 04/15/2019 CLINICAL DATA:  28 y/o F; 6 days of right upper quadrant abdominal pain. EXAM: CT ABDOMEN AND PELVIS WITH CONTRAST TECHNIQUE: Multidetector CT imaging of the abdomen and pelvis was performed using the standard protocol following bolus administration of intravenous contrast. CONTRAST:  100mL OMNIPAQUE IOHEXOL 300 MG/ML  SOLN COMPARISON:  06/26/2014 CT abdomen and pelvis. The FINDINGS: Lower chest: No acute abnormality. Hepatobiliary: No focal liver abnormality is seen. Mild gallbladder wall thickening. There appears to be radiolucent stones within the gallbladder. No biliary ductal dilatation. Pancreas: Unremarkable. No pancreatic ductal dilatation or surrounding inflammatory changes. Spleen: Normal in size without focal abnormality.  Adrenals/Urinary Tract: Adrenal glands are unremarkable. Kidneys are normal, without renal calculi, focal lesion, or hydronephrosis. Bladder is unremarkable. Stomach/Bowel: Stomach is within normal limits. Appendix appears normal. No evidence of bowel wall thickening, distention, or inflammatory changes. Vascular/Lymphatic: No significant vascular findings are present. No enlarged abdominal or pelvic lymph nodes. Reproductive: Uterus and bilateral adnexa are unremarkable. Other: No abdominal wall hernia or abnormality. No abdominopelvic ascites. Musculoskeletal: No acute or significant osseous findings. IMPRESSION: 1. Mild gallbladder wall thickening and suspected radiolucent gallstones. Findings may represent acute cholecystitis in the appropriate clinical setting. Consider right upper quadrant ultrasound for further evaluation. 2. Otherwise unremarkable CT of abdomen and pelvis. Electronically Signed   By: Mitzi HansenLance  Furusawa-Stratton M.D.   On: 04/15/2019 05:14   [place] Abdomen Limited Ruq  Result Date: 04/15/2019 CLINICAL DATA:  Right upper quadrant pain EXAM: ULTRASOUND ABDOMEN LIMITED RIGHT UPPER QUADRANT COMPARISON:  CT abdomen and pelvis February 13, 2019 FINDINGS: Gallbladder: Within the gallbladder, there are multiple echogenic foci which move and shadow consistent with cholelithiasis. Largest gallstone measures 1.2 cm. There may be calcification within a portion of the gallbladder wall. The gallbladder wall thickness is borderline prominent. No appreciable pericholecystic fluid evident. No sonographic Murphy sign noted by sonographer. Common bile duct: Diameter: 4 mm. No intrahepatic or extrahepatic biliary duct dilatation. Liver: No focal lesion identified. Liver echogenicity overall increased. Portal vein is patent on color Doppler imaging with normal direction of blood flow towards the liver. IMPRESSION: 1. Cholelithiasis with questionable foci of calcification within the gallbladder wall. Gallbladder wall is  borderline thickened. Suspect early acute cholecystitis. 2. Increase in liver echogenicity, a finding indicative of a degree of hepatic steatosis. No focal liver lesions demonstrable. Electronically Signed   By: Bretta BangWilliam  Woodruff III M.D.   On: 04/15/2019 10:05    Review of Systems  Constitutional: Negative for chills and fever.  HENT: Negative for hearing loss and tinnitus.   Eyes: Negative for blurred vision and double vision.  Respiratory: Negative for cough and shortness of breath.   Cardiovascular: Negative for chest pain and palpitations.  Gastrointestinal: Positive for abdominal pain and nausea.  Genitourinary: Negative for dysuria, frequency and urgency.  Neurological: Negative for dizziness and headaches.    Blood pressure 124/83, pulse 81, temperature  98.2 F (36.8 C), temperature source Oral, resp. rate 16, height 5\' 10"  (1.778 m), weight 97.5 kg, last menstrual period 03/22/2019, SpO2 100 %. Physical Exam  Constitutional: She is oriented to person, place, and time. She appears well-developed and well-nourished.  HENT:  Head: Normocephalic and atraumatic.  Eyes: Pupils are equal, round, and reactive to light. Conjunctivae and EOM are normal.  Neck: Normal range of motion. Neck supple.  Cardiovascular: Normal rate and regular rhythm.  Respiratory: Effort normal. No respiratory distress.  GI: Soft. She exhibits no distension.  Musculoskeletal: Normal range of motion.  Neurological: She is alert and oriented to person, place, and time.  Skin: Skin is warm and dry.     Assessment/Plan 28 y.o. F with symptomatic cholelithiasis and possible cholecystitis.    Admit to med surg floor IVF's and IV abx Recheck labs in AM Possible OR   Vanita Panda, MD 04/15/2019, 4:39 PM

## 2019-04-15 NOTE — ED Provider Notes (Signed)
Patient care assumed at 0700.  Pt here for several days of RUQ pain.  CT with possible cholecystitis, [place] pending.     [place] concerning for early cholecystitis.  Patient with ongoing pain in ED, focal RUQ tenderness.  D/w pt findings of studies.  She was treated with abx.  D/w Dr. Maisie Fus with General Surgery.  Plan to transfer to [facility] for further treatment.      Tilden Fossa, MD 04/15/19 [phone]

## 2019-04-15 NOTE — Progress Notes (Signed)
Pt stable on arrival to floor. Assessment performed. Md notified of pt arrival.

## 2019-04-15 NOTE — ED Provider Notes (Addendum)
[facility] EMERGENCY DEPARTMENT Provider Note   CSN: [id number] Arrival date & time: 04/15/19  7829    History   Chief Complaint Chief Complaint  Patient presents with  . Abdominal Pain    HPI Autumn Stephens is a 28 y.o. female.     Patient c/o ruq pain for the past 5 days. States symptoms acute onset, at rest, moderate-severe, waxing/waning in intensity, but more or less constant, non radiating. Denies specific exacerbating or alleviating factors. No specific change w eating or certain foods. No change with activity level or position. Not pleuritic. Denies strain or injury to area. No hx same symptoms in past. No hx gallstones. Denies back or flank pain. No dysuria or hematuria. lnmp 3 weeks ago. Denies vaginal discharge or bleeding. Intermittent nausea. No vomiting. Sl decreased appetite. No cough or uri symptoms. No chest pain or sob. No fever or chills.   The history is provided by the patient.  Abdominal Pain  Associated symptoms: nausea   Associated symptoms: no chest pain, no constipation, no cough, no diarrhea, no dysuria, no fever, no hematuria, no shortness of breath, no sore throat, no vaginal bleeding, no vaginal discharge and no vomiting     Past Medical History:  Diagnosis Date  . Diffuse cystic mastopathy   . Dysrhythmia    tachycardia on atenol last pregnancy  . H/O varicella   . Irritable bowel syndrome   . Maternal anemia complicating pregnancy, childbirth, or the puerperium 09/28/2012  . Migraine, unspecified, without mention of intractable migraine without mention of status migrainosus   . NSVD (normal spontaneous vaginal delivery - 11/5) 09/28/2012  . Other malaise and fatigue   . Perineal laceration, first degree 09/28/2012  . Postpartum care following vaginal delivery 09/28/2012  . Pregnancy induced hypertension   . Pyelonephritis   . Rubella nonimmune status, delivered, current hospitalization 09/28/2012  . Tachycardia     Patient Active  Problem List   Diagnosis Date Noted  . Postpartum care following vaginal delivery (4/18) 03/11/2017  . Shoulder dystocia, delivered 03/11/2017  . Second-degree perineal laceration, with delivery 03/11/2017  . Current use of beta blocker 03/10/2017    Past Surgical History:  Procedure Laterality Date  . NO PAST SURGERIES       OB History    Gravida  3   Para  3   Term  3   Preterm  0   AB  0   Living  3     SAB  0   TAB  0   Ectopic  0   Multiple  0   Live Births  3            Home Medications    Prior to Admission medications   Medication Sig Start Date End Date Taking? Authorizing Provider  omeprazole (PRILOSEC) 40 MG capsule Take one capsule every morning at least 30 minutes before first dose of Carafate. 07/25/17  Yes Molpus, John, MD    Family History Family History  Problem Relation Age of Onset  . Cancer Mother        skin  . Rheum arthritis Father   . Diabetes Maternal Grandfather   . Cancer Maternal Grandfather        prostate    Social History Social History   Tobacco Use  . Smoking status: Never Smoker  . Smokeless tobacco: Never Used  Substance Use Topics  . Alcohol use: No  . Drug use: No     Allergies  Augmentin [amoxicillin-pot clavulanate]; Avelox [moxifloxacin hcl in nacl]; Biaxin [clarithromycin]; and Moxifloxacin   Review of Systems Review of Systems  Constitutional: Negative for fever.  HENT: Negative for sore throat.   Eyes: Negative for redness.  Respiratory: Negative for cough and shortness of breath.   Cardiovascular: Negative for chest pain.  Gastrointestinal: Positive for abdominal pain and nausea. Negative for constipation, diarrhea and vomiting.  Endocrine: Negative for polyuria.  Genitourinary: Negative for dysuria, flank pain, hematuria, vaginal bleeding and vaginal discharge.  Musculoskeletal: Negative for back pain and neck pain.  Skin: Negative for rash.  Neurological: Negative for headaches.   Hematological: Does not bruise/bleed easily.  Psychiatric/Behavioral: Negative for confusion.     Physical Exam Updated Vital Signs BP (!) 137/91 (BP Location: Right Arm)   Pulse 79   Temp 97.6 F (36.4 C) (Oral)   Resp 18   Ht 1.778 m (5\' 10" )   Wt 97.5 kg   LMP 03/22/2019 (Exact Date)   SpO2 100%   BMI 30.85 kg/m   Physical Exam Vitals signs and nursing note reviewed.  Constitutional:      Appearance: Normal appearance. She is well-developed.  HENT:     Head: Atraumatic.     Nose: Nose normal.     Mouth/Throat:     Mouth: Mucous membranes are moist.  Eyes:     General: No scleral icterus.    Conjunctiva/sclera: Conjunctivae normal.     Pupils: Pupils are equal, round, and reactive to light.  Neck:     Musculoskeletal: Normal range of motion and neck supple. No neck rigidity or muscular tenderness.     Trachea: No tracheal deviation.  Cardiovascular:     Rate and Rhythm: Normal rate and regular rhythm.     Pulses: Normal pulses.     Heart sounds: Normal heart sounds. No murmur. No friction rub. No gallop.   Pulmonary:     Effort: Pulmonary effort is normal. No respiratory distress.     Breath sounds: Normal breath sounds.  Abdominal:     General: Bowel sounds are normal. There is no distension.     Palpations: Abdomen is soft. There is no mass.     Tenderness: There is abdominal tenderness. There is no guarding or rebound.     Hernia: No hernia is present.     Comments: ruq tenderness.   Genitourinary:    Comments: No cva tenderness.  Musculoskeletal:        General: No swelling or tenderness.     Right lower leg: No edema.     Left lower leg: No edema.  Skin:    General: Skin is warm and dry.     Findings: No rash.  Neurological:     Mental Status: She is alert.     Comments: Alert, speech normal.   Psychiatric:        Mood and Affect: Mood normal.      ED Treatments / Results  Labs (all labs ordered are listed, but only abnormal results are  displayed) Results for orders placed or performed during the hospital encounter of 04/15/19  CBC  Result Value Ref Range   WBC 8.3 4.0 - 10.5 K/uL   RBC 4.54 3.87 - 5.11 MIL/uL   Hemoglobin 14.5 12.0 - 15.0 g/dL   HCT 66.2 94.7 - 65.4 %   MCV 96.3 80.0 - 100.0 fL   MCH 31.9 26.0 - 34.0 pg   MCHC 33.2 30.0 - 36.0 g/dL   RDW 65.0 35.4 -  15.5 %   Platelets 202 150 - 400 K/uL   nRBC 0.0 0.0 - 0.2 %  Comprehensive metabolic panel  Result Value Ref Range   Sodium 138 135 - 145 mmol/L   Potassium 3.9 3.5 - 5.1 mmol/L   Chloride 105 98 - 111 mmol/L   CO2 25 22 - 32 mmol/L   Glucose, Bld 99 70 - 99 mg/dL   BUN 14 6 - 20 mg/dL   Creatinine, Ser 4.09 0.44 - 1.00 mg/dL   Calcium 9.1 8.9 - 81.1 mg/dL   Total Protein 7.2 6.5 - 8.1 g/dL   Albumin 4.0 3.5 - 5.0 g/dL   AST 23 15 - 41 U/L   ALT 91 (H) 0 - 44 U/L   Alkaline Phosphatase 67 38 - 126 U/L   Total Bilirubin 0.6 0.3 - 1.2 mg/dL   GFR calc non Af Amer >60 >60 mL/min   GFR calc Af Amer >60 >60 mL/min   Anion gap 8 5 - 15  Lipase, blood  Result Value Ref Range   Lipase 34 11 - 51 U/L  Urinalysis, Routine w reflex microscopic  Result Value Ref Range   Color, Urine YELLOW YELLOW   APPearance HAZY (A) CLEAR   Specific Gravity, Urine >1.030 (H) 1.005 - 1.030   pH 6.0 5.0 - 8.0   Glucose, UA NEGATIVE NEGATIVE mg/dL   Hgb urine dipstick NEGATIVE NEGATIVE   Bilirubin Urine NEGATIVE NEGATIVE   Ketones, ur NEGATIVE NEGATIVE mg/dL   Protein, ur NEGATIVE NEGATIVE mg/dL   Nitrite NEGATIVE NEGATIVE   Leukocytes,Ua NEGATIVE NEGATIVE  Pregnancy, urine  Result Value Ref Range   Preg Test, Ur NEGATIVE NEGATIVE   Ct Abdomen Pelvis W Contrast  Result Date: 04/15/2019 CLINICAL DATA:  28 y/o F; 6 days of right upper quadrant abdominal pain. EXAM: CT ABDOMEN AND PELVIS WITH CONTRAST TECHNIQUE: Multidetector CT imaging of the abdomen and pelvis was performed using the standard protocol following bolus administration of intravenous contrast.  CONTRAST:  OMNIPAQUE IOHEXOL 300 MG/ML  SOLN COMPARISON:  06/26/2014 CT abdomen and pelvis. The FINDINGS: Lower chest: No acute abnormality. Hepatobiliary: No focal liver abnormality is seen. Mild gallbladder wall thickening. There appears to be radiolucent stones within the gallbladder. No biliary ductal dilatation. Pancreas: Unremarkable. No pancreatic ductal dilatation or surrounding inflammatory changes. Spleen: Normal in size without focal abnormality. Adrenals/Urinary Tract: Adrenal glands are unremarkable. Kidneys are normal, without renal calculi, focal lesion, or hydronephrosis. Bladder is unremarkable. Stomach/Bowel: Stomach is within normal limits. Appendix appears normal. No evidence of bowel wall thickening, distention, or inflammatory changes. Vascular/Lymphatic: No significant vascular findings are present. No enlarged abdominal or pelvic lymph nodes. Reproductive: Uterus and bilateral adnexa are unremarkable. Other: No abdominal wall hernia or abnormality. No abdominopelvic ascites. Musculoskeletal: No acute or significant osseous findings. IMPRESSION: 1. Mild gallbladder wall thickening and suspected radiolucent gallstones. Findings may represent acute cholecystitis in the appropriate clinical setting. Consider right upper quadrant ultrasound for further evaluation. 2. Otherwise unremarkable CT of abdomen and pelvis. Electronically Signed   By: Mitzi Hansen M.D.   On: 04/15/2019 05:14    EKG None  Radiology Ct Abdomen Pelvis W Contrast  Result Date: 04/15/2019 CLINICAL DATA:  28 y/o F; 6 days of right upper quadrant abdominal pain. EXAM: CT ABDOMEN AND PELVIS WITH CONTRAST TECHNIQUE: Multidetector CT imaging of the abdomen and pelvis was performed using the standard protocol following bolus administration of intravenous contrast. CONTRAST:  OMNIPAQUE IOHEXOL 300 MG/ML  SOLN COMPARISON:  06/26/2014 CT abdomen and pelvis. The FINDINGS: Lower chest: No acute  abnormality. Hepatobiliary: No focal liver abnormality is seen. Mild gallbladder wall thickening. There appears to be radiolucent stones within the gallbladder. No biliary ductal dilatation. Pancreas: Unremarkable. No pancreatic ductal dilatation or surrounding inflammatory changes. Spleen: Normal in size without focal abnormality. Adrenals/Urinary Tract: Adrenal glands are unremarkable. Kidneys are normal, without renal calculi, focal lesion, or hydronephrosis. Bladder is unremarkable. Stomach/Bowel: Stomach is within normal limits. Appendix appears normal. No evidence of bowel wall thickening, distention, or inflammatory changes. Vascular/Lymphatic: No significant vascular findings are present. No enlarged abdominal or pelvic lymph nodes. Reproductive: Uterus and bilateral adnexa are unremarkable. Other: No abdominal wall hernia or abnormality. No abdominopelvic ascites. Musculoskeletal: No acute or significant osseous findings. IMPRESSION: 1. Mild gallbladder wall thickening and suspected radiolucent gallstones. Findings may represent acute cholecystitis in the appropriate clinical setting. Consider right upper quadrant ultrasound for further evaluation. 2. Otherwise unremarkable CT of abdomen and pelvis. Electronically Signed   By: Mitzi HansenLance  Furusawa-Stratton M.D.   On: 04/15/2019 05:14    Procedures Procedures (including critical care time)  Medications Ordered in ED Medications  sodium chloride 0.9 % bolus 1,000 mL (has no administration in time range)  HYDROmorphone (DILAUDID) injection 0.5 mg (has no administration in time range)  ondansetron (ZOFRAN) injection 4 mg (has no administration in time range)     Initial Impression / Assessment and Plan / ED Course  I have reviewed the triage vital signs and the nursing notes.  Pertinent labs & imaging results that were available during my care of the patient were reviewed by me and considered in my medical decision making (see chart for details).   Iv ns bolus. Dilaudid iv. zofran iv. Stat labs sent.   Reviewed nursing notes and prior charts for additional history.   Labs reviewed by me - chem normal.   Recheck pain improved. No nv. Afebrile. Await ct.   CT reviewed by me - gallstones, ?possible gb wall thickening.   Discussed ct with pt. Pt declines any additional pain medication currently. Await u/s.   0700 u/s pending. Signed out to Dr Mammie Russianeis to check u/s once done, recheck pt, and dispo appropriately.      Final Clinical Impressions(s) / ED Diagnoses   Final diagnoses:  None    ED Discharge Orders    None           Cathren LaineSteinl, Jlyn Cerros, MD 04/15/19 [phone]

## 2019-04-15 NOTE — ED Notes (Signed)
Attempted to call for report.  Ms. Victorino Dike, RN will call me back.

## 2019-04-15 NOTE — ED Notes (Signed)
ED Provider at bedside. 

## 2019-04-15 NOTE — ED Notes (Signed)
Leaving with [facility] at this time. 

## 2019-04-15 NOTE — ED Triage Notes (Signed)
Pt c/o RUQ abd pain x 6 days. Denies n/v/d.

## 2019-04-16 ENCOUNTER — Encounter (HOSPITAL_COMMUNITY)
Admission: EM | Disposition: A | Discharge status: Home / Self Care | Payer: Self-pay | Source: Home / Self Care | Attending: Emergency Medicine

## 2019-04-16 ENCOUNTER — Observation Stay (HOSPITAL_COMMUNITY): Payer: 59

## 2019-04-16 ENCOUNTER — Observation Stay (HOSPITAL_COMMUNITY): Payer: 59 | Admitting: Registered Nurse

## 2019-04-16 ENCOUNTER — Encounter (HOSPITAL_COMMUNITY): Payer: Self-pay | Admitting: Registered Nurse

## 2019-04-16 HISTORY — PX: CHOLECYSTECTOMY: SHX55

## 2019-04-16 LAB — COMPREHENSIVE METABOLIC PANEL
ALT: 56 U/L — ABNORMAL HIGH (ref 0–44)
AST: 16 U/L (ref 15–41)
Albumin: 3.5 g/dL (ref 3.5–5.0)
Alkaline Phosphatase: 58 U/L (ref 38–126)
Anion gap: 10 (ref 5–15)
BUN: 9 mg/dL (ref 6–20)
CO2: 20 mmol/L — ABNORMAL LOW (ref 22–32)
Calcium: 8.2 mg/dL — ABNORMAL LOW (ref 8.9–10.3)
Chloride: 107 mmol/L (ref 98–111)
Creatinine, Ser: 0.73 mg/dL (ref 0.44–1.00)
GFR calc Af Amer: >60 mL/min (ref >60)
GFR calc non Af Amer: >60 mL/min (ref >60)
Glucose, Bld: 82 mg/dL (ref 70–99)
Potassium: 3.8 mmol/L (ref 3.5–5.1)
Sodium: 137 mmol/L (ref 135–145)
Total Bilirubin: 0.9 mg/dL (ref 0.3–1.2)
Total Protein: 6.5 g/dL (ref 6.5–8.1)

## 2019-04-16 LAB — CBC
HCT: 39.4 % (ref 36.0–46.0)
Hemoglobin: 12.6 g/dL (ref 12.0–15.0)
MCH: 31.4 pg (ref 26.0–34.0)
MCHC: 32 g/dL (ref 30.0–36.0)
MCV: 98.3 fL (ref 80.0–100.0)
Platelets: 171 10*3/uL (ref 150–400)
RBC: 4.01 MIL/uL (ref 3.87–5.11)
RDW: 12.3 % (ref 11.5–15.5)
WBC: 7.6 10*3/uL (ref 4.0–10.5)
nRBC: 0 % (ref 0.0–0.2)

## 2019-04-16 LAB — SURGICAL PCR SCREEN
MRSA, PCR: NEGATIVE
Staphylococcus aureus: NEGATIVE

## 2019-04-16 SURGERY — LAPAROSCOPIC CHOLECYSTECTOMY WITH INTRAOPERATIVE CHOLANGIOGRAM
Anesthesia: General | Site: Abdomen

## 2019-04-16 MED ORDER — BUPIVACAINE-EPINEPHRINE (PF) 0.25% -1:200000 IJ SOLN
INTRAMUSCULAR | Status: AC
  Filled 2019-04-16: qty 30

## 2019-04-16 MED ORDER — DEXAMETHASONE SODIUM PHOSPHATE 10 MG/ML IJ SOLN
INTRAMUSCULAR | Status: DC | PRN
  Administered 2019-04-16: 10 mg via INTRAVENOUS

## 2019-04-16 MED ORDER — LIDOCAINE 2% (20 MG/ML) 5 ML SYRINGE
INTRAMUSCULAR | Status: DC | PRN
  Administered 2019-04-16: 75 mg via INTRAVENOUS
  Administered 2019-04-16: 25 mg via INTRAVENOUS

## 2019-04-16 MED ORDER — CEFAZOLIN SODIUM-DEXTROSE 2-4 GM/100ML-% IV SOLN
INTRAVENOUS | Status: AC
  Filled 2019-04-16: qty 100

## 2019-04-16 MED ORDER — SUGAMMADEX SODIUM 500 MG/5ML IV SOLN
INTRAVENOUS | Status: DC | PRN
  Administered 2019-04-16: 300 mg via INTRAVENOUS

## 2019-04-16 MED ORDER — LACTATED RINGERS IV SOLN
INTRAVENOUS | Status: AC | PRN
  Administered 2019-04-16: 1000 mL

## 2019-04-16 MED ORDER — ONDANSETRON HCL 4 MG/2ML IJ SOLN
INTRAMUSCULAR | Status: DC | PRN
  Administered 2019-04-16: 4 mg via INTRAVENOUS

## 2019-04-16 MED ORDER — LACTATED RINGERS IV SOLN
INTRAVENOUS | Status: DC | PRN
  Administered 2019-04-16 (×2): via INTRAVENOUS

## 2019-04-16 MED ORDER — MIDAZOLAM HCL 2 MG/2ML IJ SOLN
INTRAMUSCULAR | Status: AC
  Filled 2019-04-16: qty 2

## 2019-04-16 MED ORDER — HYDROCODONE-ACETAMINOPHEN 5-325 MG PO TABS
1.0000 | ORAL_TABLET | ORAL | Status: DC | PRN
  Administered 2019-04-16: 1 via ORAL
  Administered 2019-04-17: 2 via ORAL
  Filled 2019-04-16: qty 1
  Filled 2019-04-16: qty 2

## 2019-04-16 MED ORDER — PROPOFOL 10 MG/ML IV BOLUS
INTRAVENOUS | Status: AC
  Filled 2019-04-16: qty 20

## 2019-04-16 MED ORDER — FENTANYL CITRATE (PF) 250 MCG/5ML IJ SOLN
INTRAMUSCULAR | Status: AC
  Filled 2019-04-16: qty 5

## 2019-04-16 MED ORDER — OXYCODONE HCL 5 MG/5ML PO SOLN: 5.0000 mg | Freq: Once | ORAL | Status: DC | PRN

## 2019-04-16 MED ORDER — 0.9 % SODIUM CHLORIDE (POUR BTL) OPTIME
TOPICAL | Status: DC | PRN
  Administered 2019-04-16: 1000 mL

## 2019-04-16 MED ORDER — MIDAZOLAM HCL 5 MG/5ML IJ SOLN
INTRAMUSCULAR | Status: DC | PRN
  Administered 2019-04-16: 1 mg via INTRAVENOUS
  Administered 2019-04-16: 2 mg via INTRAVENOUS

## 2019-04-16 MED ORDER — DEXAMETHASONE SODIUM PHOSPHATE 10 MG/ML IJ SOLN
INTRAMUSCULAR | Status: AC
  Filled 2019-04-16: qty 1

## 2019-04-16 MED ORDER — ROCURONIUM BROMIDE 10 MG/ML (PF) SYRINGE
PREFILLED_SYRINGE | INTRAVENOUS | Status: DC | PRN
  Administered 2019-04-16: 60 mg via INTRAVENOUS

## 2019-04-16 MED ORDER — HYDROMORPHONE HCL 1 MG/ML IJ SOLN
0.2500 mg | INTRAMUSCULAR | Status: DC | PRN
  Administered 2019-04-16 (×2): 0.5 mg via INTRAVENOUS

## 2019-04-16 MED ORDER — KETOROLAC TROMETHAMINE 30 MG/ML IJ SOLN: 30.0000 mg | Freq: Once | INTRAMUSCULAR | Status: DC | PRN

## 2019-04-16 MED ORDER — SUGAMMADEX SODIUM 500 MG/5ML IV SOLN
INTRAVENOUS | Status: AC
  Filled 2019-04-16: qty 5

## 2019-04-16 MED ORDER — PROMETHAZINE HCL 25 MG/ML IJ SOLN: 6.2500 mg | INTRAMUSCULAR | Status: DC | PRN

## 2019-04-16 MED ORDER — LABETALOL HCL 5 MG/ML IV SOLN
INTRAVENOUS | Status: DC | PRN
  Administered 2019-04-16 (×3): 5 mg via INTRAVENOUS

## 2019-04-16 MED ORDER — HYDROMORPHONE HCL 1 MG/ML IJ SOLN
INTRAMUSCULAR | Status: AC
  Filled 2019-04-16: qty 1

## 2019-04-16 MED ORDER — OXYCODONE HCL 5 MG PO TABS: 5.0000 mg | ORAL_TABLET | Freq: Once | ORAL | Status: DC | PRN

## 2019-04-16 MED ORDER — BUPIVACAINE-EPINEPHRINE 0.25% -1:200000 IJ SOLN
INTRAMUSCULAR | Status: DC | PRN
  Administered 2019-04-16: 25 mL

## 2019-04-16 MED ORDER — ONDANSETRON HCL 4 MG/2ML IJ SOLN
INTRAMUSCULAR | Status: AC
  Filled 2019-04-16: qty 2

## 2019-04-16 MED ORDER — PROPOFOL 10 MG/ML IV BOLUS
INTRAVENOUS | Status: DC | PRN
  Administered 2019-04-16: 20 mg via INTRAVENOUS
  Administered 2019-04-16: 50 mg via INTRAVENOUS
  Administered 2019-04-16: 200 mg via INTRAVENOUS

## 2019-04-16 MED ORDER — FENTANYL CITRATE (PF) 100 MCG/2ML IJ SOLN
INTRAMUSCULAR | Status: DC | PRN
  Administered 2019-04-16 (×3): 50 ug via INTRAVENOUS
  Administered 2019-04-16 (×2): 100 ug via INTRAVENOUS
  Administered 2019-04-16: 50 ug via INTRAVENOUS
  Administered 2019-04-16: 100 ug via INTRAVENOUS

## 2019-04-16 MED ORDER — SUCCINYLCHOLINE CHLORIDE 200 MG/10ML IV SOSY
PREFILLED_SYRINGE | INTRAVENOUS | Status: DC | PRN
  Administered 2019-04-16: 140 mg via INTRAVENOUS

## 2019-04-16 MED ORDER — IOHEXOL 300 MG/ML  SOLN
INTRAMUSCULAR | Status: DC | PRN
  Administered 2019-04-16: 50 mL

## 2019-04-16 SURGICAL SUPPLY — 33 items
ADH SKN CLS APL DERMABOND .7 (GAUZE/BANDAGES/DRESSINGS) ×1
APL PRP STRL LF DISP 70% ISPRP (MISCELLANEOUS) ×1
APPLIER CLIP 5 13 M/L LIGAMAX5 (MISCELLANEOUS) ×2
APR CLP MED LRG 5 ANG JAW (MISCELLANEOUS) ×1
BAG SPEC RTRVL LRG 6X4 10 (ENDOMECHANICALS) ×1
CABLE HIGH FREQUENCY MONO STRZ (ELECTRODE) ×2 IMPLANT
CATH REDDICK CHOLANGI 4FR 50CM (CATHETERS) ×2 IMPLANT
CHLORAPREP W/TINT 26 (MISCELLANEOUS) ×2 IMPLANT
CLIP APPLIE 5 13 M/L LIGAMAX5 (MISCELLANEOUS) ×1 IMPLANT
COVER MAYO STAND STRL (DRAPES) ×2 IMPLANT
COVER WAND RF STERILE (DRAPES) IMPLANT
DECANTER SPIKE VIAL GLASS SM (MISCELLANEOUS) ×2 IMPLANT
DERMABOND ADVANCED (GAUZE/BANDAGES/DRESSINGS) ×1
DERMABOND ADVANCED .7 DNX12 (GAUZE/BANDAGES/DRESSINGS) ×1 IMPLANT
DRAPE C-ARM 42X120 X-RAY (DRAPES) ×2 IMPLANT
ELECT REM PT RETURN 15FT ADLT (MISCELLANEOUS) ×2 IMPLANT
GLOVE BIO SURGEON STRL SZ7.5 (GLOVE) ×2 IMPLANT
GOWN STRL REUS W/TWL XL LVL3 (GOWN DISPOSABLE) ×6 IMPLANT
HEMOSTAT SURGICEL 4X8 (HEMOSTASIS) IMPLANT
IV CATH 14GX2 1/4 (CATHETERS) ×2 IMPLANT
KIT BASIN OR (CUSTOM PROCEDURE TRAY) ×2 IMPLANT
KIT TURNOVER KIT A (KITS) IMPLANT
POUCH SPECIMEN RETRIEVAL 10MM (ENDOMECHANICALS) ×2 IMPLANT
SCISSORS LAP 5X35 DISP (ENDOMECHANICALS) ×2 IMPLANT
SET IRRIG TUBING LAPAROSCOPIC (IRRIGATION / IRRIGATOR) ×2 IMPLANT
SET TUBE SMOKE EVAC HIGH FLOW (TUBING) ×1 IMPLANT
SLEEVE XCEL OPT CAN 5 100 (ENDOMECHANICALS) ×4 IMPLANT
SUT MNCRL AB 4-0 PS2 18 (SUTURE) ×2 IMPLANT
TOWEL OR 17X26 10 PK STRL BLUE (TOWEL DISPOSABLE) ×2 IMPLANT
TOWEL OR NON WOVEN STRL DISP B (DISPOSABLE) ×2 IMPLANT
TRAY LAPAROSCOPIC (CUSTOM PROCEDURE TRAY) ×2 IMPLANT
TROCAR BLADELESS OPT 5 100 (ENDOMECHANICALS) ×2 IMPLANT
TROCAR XCEL BLUNT TIP 100MML (ENDOMECHANICALS) ×2 IMPLANT

## 2019-04-16 NOTE — Anesthesia Postprocedure Evaluation (Signed)
Anesthesia Post Note  Patient: Autumn Stephens  Procedure(s) Performed: LAPAROSCOPIC CHOLECYSTECTOMY WITH INTRAOPERATIVE CHOLANGIOGRAM (N/A Abdomen)     Patient location during evaluation: PACU Anesthesia Type: General Level of consciousness: awake and alert Pain management: pain level controlled Vital Signs Assessment: post-procedure vital signs reviewed and stable Respiratory status: spontaneous breathing, nonlabored ventilation, respiratory function stable and patient connected to nasal cannula oxygen Cardiovascular status: blood pressure returned to baseline and stable Postop Assessment: no apparent nausea or vomiting Anesthetic complications: no    Last Vitals:  Vitals:   04/16/19 1130 04/16/19 1145  BP: 133/78 128/71  Pulse: 72 72  Resp: 19 12  Temp:    SpO2: 100% 100%    Last Pain:  Vitals:   04/16/19 1145  TempSrc:   PainSc: Asleep                 Angelyne Terwilliger S

## 2019-04-16 NOTE — Transfer of Care (Signed)
Immediate Anesthesia Transfer of Care Note  Patient: Autumn Stephens  Procedure(s) Performed: LAPAROSCOPIC CHOLECYSTECTOMY WITH INTRAOPERATIVE CHOLANGIOGRAM (N/A Abdomen)  Patient Location: PACU  Anesthesia Type:General  Level of Consciousness: awake, alert , oriented and patient cooperative  Airway & Oxygen Therapy: Patient Spontanous Breathing and Patient connected to face mask oxygen  Post-op Assessment: Report given to RN, Post -op Vital signs reviewed and stable and Patient moving all extremities X 4  Post vital signs: stable  Last Vitals:  Vitals Value Taken Time  BP 137/75 04/16/2019 11:17 AM  Temp 36.9 C 04/16/2019 11:15 AM  Pulse 84 04/16/2019 11:28 AM  Resp 8 04/16/2019 11:28 AM  SpO2 100 % 04/16/2019 11:28 AM  Vitals shown include unvalidated device data.  Last Pain:  Vitals:   04/16/19 0502  TempSrc: Oral  PainSc:       Patients Stated Pain Goal: 2 (04/15/19 1947)  Complications: No apparent anesthesia complications

## 2019-04-16 NOTE — Op Note (Signed)
04/15/2019 - 04/16/2019  11:02 AM  PATIENT:  Autumn Stephens  28 y.o. female  PRE-OPERATIVE DIAGNOSIS:  gallstones  POST-OPERATIVE DIAGNOSIS:  gallstones  PROCEDURE:  Procedure(s): LAPAROSCOPIC CHOLECYSTECTOMY WITH INTRAOPERATIVE CHOLANGIOGRAM (N/A)  SURGEON:  Surgeon(s) and Role:    * Griselda Miner, MD - Primary  PHYSICIAN ASSISTANT:   ASSISTANTS: none   ANESTHESIA:   local and general  EBL:  25 mL   BLOOD ADMINISTERED:none  DRAINS: none   LOCAL MEDICATIONS USED:  MARCAINE     SPECIMEN:  Source of Specimen:  gallbladder  DISPOSITION OF SPECIMEN:  PATHOLOGY  COUNTS:  YES  TOURNIQUET:  * No tourniquets in log *  DICTATION: .Dragon Dictation     Procedure: After informed consent was obtained the patient was brought to the operating room and placed in the supine position on the operating room table. After adequate induction of general anesthesia the patient's abdomen was prepped with ChloraPrep allowed to dry and draped in usual sterile manner. An appropriate timeout was performed. The area below the umbilicus was infiltrated with quarter percent  Marcaine. A small incision was made with a 15 blade knife. The incision was carried down through the subcutaneous tissue bluntly with a hemostat and [employer] retractors. The linea alba was identified. The linea alba was incised with a 15 blade knife and each side was grasped with Coker clamps. The preperitoneal space was then probed with a hemostat until the peritoneum was opened and access was gained to the abdominal cavity. A 0 Vicryl pursestring stitch was placed in the fascia surrounding the opening. A Hassan cannula was then placed through the opening and anchored in place with the previously placed Vicryl purse string stitch. The abdomen was insufflated with carbon dioxide without difficulty. A laparoscope was inserted through the [facility] cannula in the right upper quadrant was inspected. Next the epigastric region was infiltrated  with % Marcaine. A small incision was made with a 15 blade knife. A 5 mm port was placed bluntly through this incision into the abdominal cavity under direct vision. Next 2 sites were chosen laterally on the right side of the abdomen for placement of 5 mm ports. Each of these areas was infiltrated with quarter percent Marcaine. Small stab incisions were made with a 15 blade knife. 5 mm ports were then placed bluntly through these incisions into the abdominal cavity under direct vision without difficulty. A blunt grasper was placed through the lateralmost 5 mm port and used to grasp the dome of the gallbladder and elevated anteriorly and superiorly. Another blunt grasper was placed through the other 5 mm port and used to retract the body and neck of the gallbladder. A dissector was placed through the epigastric port and using the electrocautery the peritoneal reflection at the gallbladder neck was opened. Blunt dissection was then carried out in this area until the gallbladder neck-cystic duct junction was readily identified and a good window was created. A single clip was placed on the gallbladder neck. A small  ductotomy was made just below the clip with laparoscopic scissors. A 14-gauge Angiocath was then placed through the anterior abdominal wall under direct vision. A Reddick cholangiogram catheter was then placed through the Angiocath and flushed. The catheter was then placed in the cystic duct and anchored in place with a clip. A cholangiogram was obtained that showed no filling defects good emptying into the duodenum an adequate length on the cystic duct. The anchoring clip and catheters were then removed from the patient. 3  clips were placed proximally on the cystic duct and the duct was divided between the 2 sets of clips. Posterior to this the cystic artery was identified and again dissected bluntly in a circumferential manner until a good window  was created. 2 clips were placed proximally and one  distally on the artery and the artery was divided between the 2 sets of clips. Next a laparoscopic hook cautery device was used to separate the gallbladder from the liver bed. Prior to completely detaching the gallbladder from the liver bed the liver bed was inspected and several small bleeding points were coagulated with the electrocautery until the area was completely hemostatic. The gallbladder was then detached the rest of it from the liver bed without difficulty. A laparoscopic bag was inserted through the hassan port. The laparoscope was moved to the epigastric port. The gallbladder was placed within the bag and the bag was sealed.  The bag with the gallbladder was then [facility] the [facility] through the infraumbilical port without difficulty. The fascial defect was then closed with the previously placed Vicryl pursestring stitch as well as with another figure-of-eight 0 Vicryl stitch. The liver bed was inspected again and found to be hemostatic. The abdomen was irrigated with copious amounts of saline until the effluent was clear. The ports were then removed under direct vision without difficulty and were found to be hemostatic. The gas was allowed to escape. The skin incisions were all closed with interrupted 4-0 Monocryl subcuticular stitches. Dermabond dressings were applied. The patient tolerated the procedure well. At the end of the case all needle sponge and instrument counts were correct. The patient was then awakened and taken to recovery in stable condition  PLAN OF CARE: Admit for overnight observation  PATIENT DISPOSITION:  PACU - hemodynamically stable.   Delay start of Pharmacological VTE agent (>24hrs) due to surgical blood loss or risk of bleeding: no

## 2019-04-16 NOTE — Progress Notes (Signed)
Subjective/Chief Complaint: No complaints   Objective: Vital signs in last 24 hours: Temp:  [98.1 F (36.7 C)-98.9 F (37.2 C)] 98.6 F (37 C) (05/24 0502) Pulse Rate:  [78-95] 79 (05/24 0502) Resp:  [16] 16 (05/24 0502) BP: (124-142)/(73-101) 127/73 (05/24 0502) SpO2:  [94 %-100 %] 97 % (05/24 0502) Last BM Date: 04/14/19  Intake/Output from previous day: 05/23 0701 - 05/24 0700 In: 2533.8 [P.O.:600; I.V.:837.5; IV Piggyback:1096.3] Out: 700 [Urine:700] Intake/Output this shift: Total I/O In: -  Out: 500 [Urine:500]  General appearance: alert and cooperative Resp: clear to auscultation bilaterally Cardio: regular rate and rhythm GI: soft, non-tender; bowel sounds normal; no masses,  no organomegaly  Lab Results:  Recent Labs    04/15/19 0347 04/16/19 0436  WBC 8.3 7.6  HGB 14.5 12.6  HCT 43.7 39.4  PLT 202 171   BMET Recent Labs    04/15/19 0347 04/16/19 0436  NA 138 137  K 3.9 3.8  CL 105 107  CO2 25 20*  GLUCOSE 99 82  BUN 14 9  CREATININE 0.84 0.73  CALCIUM 9.1 8.2*   PT/INR No results for input(s): LABPROT, INR in the last 72 hours. ABG No results for input(s): PHART, HCO3 in the last 72 hours.  Invalid input(s): PCO2, PO2  Studies/Results: Ct Abdomen Pelvis W Contrast  Result Date: 04/15/2019 CLINICAL DATA:  28 y/o F; 6 days of right upper quadrant abdominal pain. EXAM: CT ABDOMEN AND PELVIS WITH CONTRAST TECHNIQUE: Multidetector CT imaging of the abdomen and pelvis was performed using the standard protocol following bolus administration of intravenous contrast. CONTRAST:  100mL OMNIPAQUE IOHEXOL 300 MG/ML  SOLN COMPARISON:  06/26/2014 CT abdomen and pelvis. The FINDINGS: Lower chest: No acute abnormality. Hepatobiliary: No focal liver abnormality is seen. Mild gallbladder wall thickening. There appears to be radiolucent stones within the gallbladder. No biliary ductal dilatation. Pancreas: Unremarkable. No pancreatic ductal dilatation or  surrounding inflammatory changes. Spleen: Normal in size without focal abnormality. Adrenals/Urinary Tract: Adrenal glands are unremarkable. Kidneys are normal, without renal calculi, focal lesion, or hydronephrosis. Bladder is unremarkable. Stomach/Bowel: Stomach is within normal limits. Appendix appears normal. No evidence of bowel wall thickening, distention, or inflammatory changes. Vascular/Lymphatic: No significant vascular findings are present. No enlarged abdominal or pelvic lymph nodes. Reproductive: Uterus and bilateral adnexa are unremarkable. Other: No abdominal wall hernia or abnormality. No abdominopelvic ascites. Musculoskeletal: No acute or significant osseous findings. IMPRESSION: 1. Mild gallbladder wall thickening and suspected radiolucent gallstones. Findings may represent acute cholecystitis in the appropriate clinical setting. Consider right upper quadrant ultrasound for further evaluation. 2. Otherwise unremarkable CT of abdomen and pelvis. Electronically Signed   By: Mitzi HansenLance  Furusawa-Stratton M.D.   On: 04/15/2019 05:14   [place] Abdomen Limited Ruq  Result Date: 04/15/2019 CLINICAL DATA:  Right upper quadrant pain EXAM: ULTRASOUND ABDOMEN LIMITED RIGHT UPPER QUADRANT COMPARISON:  CT abdomen and pelvis February 13, 2019 FINDINGS: Gallbladder: Within the gallbladder, there are multiple echogenic foci which move and shadow consistent with cholelithiasis. Largest gallstone measures 1.2 cm. There may be calcification within a portion of the gallbladder wall. The gallbladder wall thickness is borderline prominent. No appreciable pericholecystic fluid evident. No sonographic Murphy sign noted by sonographer. Common bile duct: Diameter: 4 mm. No intrahepatic or extrahepatic biliary duct dilatation. Liver: No focal lesion identified. Liver echogenicity overall increased. Portal vein is patent on color Doppler imaging with normal direction of blood flow towards the liver. IMPRESSION: 1. Cholelithiasis with  questionable foci of calcification within the  gallbladder wall. Gallbladder wall is borderline thickened. Suspect early acute cholecystitis. 2. Increase in liver echogenicity, a finding indicative of a degree of hepatic steatosis. No focal liver lesions demonstrable. Electronically Signed   By: Bretta Bang III M.D.   On: 04/15/2019 10:05    Anti-infectives: Anti-infectives (From admission, onward)   Start     Dose/Rate Route Frequency Ordered Stop   04/16/19 1000  cefTRIAXone (ROCEPHIN) 2 g in sodium chloride 0.9 % 100 mL IVPB     2 g 200 mL/hr over 30 Minutes Intravenous Every 24 hours 04/15/19 1851     04/15/19 1030  cefTRIAXone (ROCEPHIN) 2 g in sodium chloride 0.9 % 100 mL IVPB     2 g 200 mL/hr over 30 Minutes Intravenous  Once 04/15/19 1015 04/15/19 1109      Assessment/Plan: s/p * No surgery found * the pt appears to have symptomatic gallstones.  Because of the risk of further painful episodes and possible pancreatitis I think she will benefit from having gallbladder removed. I have discussed with her in detail the risks and benefits of the surgery as well as some of the technical aspects and she understands and wishes to proceed  LOS: 0 days    Chevis Pretty III 04/16/2019

## 2019-04-16 NOTE — Anesthesia Procedure Notes (Deleted)
Performed by: Chiffon Kittleson E, CRNA       

## 2019-04-16 NOTE — Anesthesia Preprocedure Evaluation (Signed)
Anesthesia Evaluation  Patient identified by MRN, date of birth, ID band Patient awake    Reviewed: Allergy & Precautions, NPO status , Patient's Chart, lab work & pertinent test results  Airway Mallampati: II  TM Distance: >3 FB Neck ROM: Full    Dental no notable dental hx.    Pulmonary neg pulmonary ROS,    Pulmonary exam normal breath sounds clear to auscultation       Cardiovascular hypertension, Normal cardiovascular exam Rhythm:Regular Rate:Normal     Neuro/Psych negative neurological ROS  negative psych ROS   GI/Hepatic negative GI ROS, Neg liver ROS,   Endo/Other  negative endocrine ROS  Renal/GU negative Renal ROS  negative genitourinary   Musculoskeletal negative musculoskeletal ROS (+)   Abdominal   Peds negative pediatric ROS (+)  Hematology negative hematology ROS (+)   Anesthesia Other Findings   Reproductive/Obstetrics negative OB ROS                             Anesthesia Physical Anesthesia Plan  ASA: II and emergent  Anesthesia Plan: General   Post-op Pain Management:    Induction: Intravenous and Rapid sequence  PONV Risk Score and Plan: 3 and Ondansetron, Dexamethasone, Treatment may vary due to age or medical condition and Midazolam  Airway Management Planned: Oral ETT  Additional Equipment:   Intra-op Plan:   Post-operative Plan: Extubation in OR  Informed Consent: I have reviewed the patients History and Physical, chart, labs and discussed the procedure including the risks, benefits and alternatives for the proposed anesthesia with the patient or authorized representative who has indicated his/her understanding and acceptance.     Dental advisory given  Plan Discussed with: CRNA and Surgeon  Anesthesia Plan Comments:         Anesthesia Quick Evaluation

## 2019-04-16 NOTE — Anesthesia Procedure Notes (Signed)
Procedure Name: Intubation Date/Time: 04/16/2019 9:57 AM Performed by: Lissa Morales, CRNA Pre-anesthesia Checklist: Patient identified, Emergency Drugs available, Suction available and Patient being monitored Patient Re-evaluated:Patient Re-evaluated prior to induction Oxygen Delivery Method: Circle system utilized Preoxygenation: Pre-oxygenation with 100% oxygen Induction Type: IV induction Ventilation: Mask ventilation without difficulty Laryngoscope Size: Mac and 4 Grade View: Grade I Tube type: Oral Number of attempts: 1 Airway Equipment and Method: Stylet and Oral airway Placement Confirmation: ETT inserted through vocal cords under direct vision,  positive ETCO2 and breath sounds checked- equal and bilateral Secured at: 21 cm Tube secured with: Tape Dental Injury: Teeth and Oropharynx as per pre-operative assessment

## 2019-04-17 ENCOUNTER — Encounter (HOSPITAL_COMMUNITY): Payer: Self-pay | Admitting: General Surgery

## 2019-04-17 MED ORDER — HYDROCODONE-ACETAMINOPHEN 5-325 MG PO TABS: 1.0000 | ORAL_TABLET | Freq: Four times a day (QID) | ORAL | 0 refills | Status: DC | PRN

## 2019-04-17 NOTE — Progress Notes (Signed)
1 Day Post-Op   Subjective/Chief Complaint: Didn't sleep much but feels ok. Tolerating diet   Objective: Vital signs in last 24 hours: Temp:  [97.4 F (36.3 C)-98.5 F (36.9 C)] 97.4 F (36.3 C) (05/25 0457) Pulse Rate:  [67-104] 67 (05/25 0457) Resp:  [7-19] 16 (05/25 0457) BP: (94-161)/(63-101) 125/83 (05/25 0457) SpO2:  [96 %-100 %] 98 % (05/25 0457) Last BM Date: 04/14/19  Intake/Output from previous day: 05/24 0701 - 05/25 0700 In: 3646 [P.O.:716; I.V.:2830; IV Piggyback:100] Out: 3225 [Urine:3200; Blood:25] Intake/Output this shift: No intake/output data recorded.  General appearance: alert and cooperative Resp: clear to auscultation bilaterally Cardio: regular rate and rhythm GI: soft, mild tenderness  Lab Results:  Recent Labs    04/15/19 0347 04/16/19 0436  WBC 8.3 7.6  HGB 14.5 12.6  HCT 43.7 39.4  PLT 202 171   BMET Recent Labs    04/15/19 0347 04/16/19 0436  NA 138 137  K 3.9 3.8  CL 105 107  CO2 25 20*  GLUCOSE 99 82  BUN 14 9  CREATININE 0.84 0.73  CALCIUM 9.1 8.2*   PT/INR No results for input(s): LABPROT, INR in the last 72 hours. ABG No results for input(s): PHART, HCO3 in the last 72 hours.  Invalid input(s): PCO2, PO2  Studies/Results: Dg Cholangiogram Operative  Result Date: 04/16/2019 CLINICAL DATA:  28 year old female with a history of cholelithiasis EXAM: INTRAOPERATIVE CHOLANGIOGRAM TECHNIQUE: Cholangiographic images from the C-arm fluoroscopic device were submitted for interpretation post-operatively. Please see the procedural report for the amount of contrast and the fluoroscopy time utilized. COMPARISON:  None. FINDINGS: Surgical instruments project over the upper abdomen. There is cannulation of the cystic duct/gallbladder neck, with antegrade infusion of contrast. Caliber of the extrahepatic ductal system within normal limits. No definite filling defect within the extrahepatic ducts identified. Free flow of contrast across  the ampulla. IMPRESSION: Intraoperative cholangiogram demonstrates extrahepatic biliary ducts of unremarkable caliber, with no definite filling defects identified. Free flow of contrast across the ampulla. Please refer to the dictated operative report for full details of intraoperative findings and procedure Electronically Signed   By: Gilmer Mor D.O.   On: 04/16/2019 12:28   [place] Abdomen Limited Ruq  Result Date: 04/15/2019 CLINICAL DATA:  Right upper quadrant pain EXAM: ULTRASOUND ABDOMEN LIMITED RIGHT UPPER QUADRANT COMPARISON:  CT abdomen and pelvis February 13, 2019 FINDINGS: Gallbladder: Within the gallbladder, there are multiple echogenic foci which move and shadow consistent with cholelithiasis. Largest gallstone measures 1.2 cm. There may be calcification within a portion of the gallbladder wall. The gallbladder wall thickness is borderline prominent. No appreciable pericholecystic fluid evident. No sonographic Murphy sign noted by sonographer. Common bile duct: Diameter: 4 mm. No intrahepatic or extrahepatic biliary duct dilatation. Liver: No focal lesion identified. Liver echogenicity overall increased. Portal vein is patent on color Doppler imaging with normal direction of blood flow towards the liver. IMPRESSION: 1. Cholelithiasis with questionable foci of calcification within the gallbladder wall. Gallbladder wall is borderline thickened. Suspect early acute cholecystitis. 2. Increase in liver echogenicity, a finding indicative of a degree of hepatic steatosis. No focal liver lesions demonstrable. Electronically Signed   By: Bretta Bang Stephens M.D.   On: 04/15/2019 10:05    Anti-infectives: Anti-infectives (From admission, onward)   Start     Dose/Rate Route Frequency Ordered Stop   04/16/19 1000  cefTRIAXone (ROCEPHIN) 2 g in sodium chloride 0.9 % 100 mL IVPB     2 g 200 mL/hr over 30 Minutes  Intravenous Every 24 hours 04/15/19 1851     04/16/19 0943  ceFAZolin (ANCEF) 2-4 GM/100ML-%  IVPB  Status:  Discontinued    Note to Pharmacy:  Juanda Crumbleanney, Michael   : cabinet override      04/16/19 0943 04/16/19 0949   04/15/19 1030  cefTRIAXone (ROCEPHIN) 2 g in sodium chloride 0.9 % 100 mL IVPB     2 g 200 mL/hr over 30 Minutes Intravenous  Once 04/15/19 1015 04/15/19 1109      Assessment/Plan: s/p Procedure(s): LAPAROSCOPIC CHOLECYSTECTOMY WITH INTRAOPERATIVE CHOLANGIOGRAM (N/A) Advance diet Discharge  LOS: 0 days    Autumn Stephens 04/17/2019

## 2019-04-17 NOTE — Plan of Care (Signed)
Patient discharged home in stable condition. She is waiting on her ride 

## 2019-04-17 NOTE — Discharge Summary (Signed)
Physician Discharge Summary  Patient ID: Autumn Stephens MRN: [id number] DOB/AGE: 05-19-1991 28 y.o.  Admit date: 04/15/2019 Discharge date: 04/17/2019  Admission Diagnoses:  Discharge Diagnoses:  Active Problems:   Cholecystitis   Cholelithiasis   Discharged Condition: good  Hospital Course: the pt underwent lap chole. She tolerated surgery well. On pod 1 she was ready for d/c home  Consults: None  Significant Diagnostic Studies: none  Treatments: surgery: as above  Discharge Exam: Blood pressure 125/83, pulse 67, temperature (!) 97.4 F (36.3 C), temperature source Oral, resp. rate 16, height 5\' 10"  (1.778 m), weight 97.5 kg, last menstrual period 03/22/2019, SpO2 98 %. General appearance: alert and cooperative Resp: clear to auscultation bilaterally Cardio: regular rate and rhythm GI: soft, mild tenderness  Disposition: Discharge disposition: 01-Home or Self Care       Discharge Instructions    Call MD for:  difficulty breathing, headache or visual disturbances   Complete by:  As directed    Call MD for:  extreme fatigue   Complete by:  As directed    Call MD for:  hives   Complete by:  As directed    Call MD for:  persistant dizziness or light-headedness   Complete by:  As directed    Call MD for:  persistant nausea and vomiting   Complete by:  As directed    Call MD for:  redness, tenderness, or signs of infection (pain, swelling, redness, odor or green/yellow discharge around incision site)   Complete by:  As directed    Call MD for:  severe uncontrolled pain   Complete by:  As directed    Call MD for:  temperature >100.4   Complete by:  As directed    Diet - low sodium heart healthy   Complete by:  As directed    Discharge instructions   Complete by:  As directed    May shower. Low fat diet. No heavy lifting   Increase activity slowly   Complete by:  As directed    No wound care   Complete by:  As directed      Allergies as of 04/17/2019    Reactions   Augmentin [amoxicillin-pot Clavulanate] Nausea And Vomiting   Did it involve swelling of the face/tongue/throat, SOB, or low BP? No Did it involve sudden or severe rash/hives, skin peeling, or any reaction on the inside of your mouth or nose? No Did you need to seek medical attention at a hospital or doctor's office? No When did it last happen?"several months ago" If all above answers are "NO", may proceed with cephalosporin use.   Biaxin [clarithromycin] Nausea And Vomiting   Moxifloxacin Nausea And Vomiting      Medication List    TAKE these medications   HYDROcodone-acetaminophen 5-325 MG tablet Commonly known as:  NORCO/VICODIN Take 1-2 tablets by mouth every 6 (six) hours as needed for moderate pain.   omeprazole 40 MG capsule Commonly known as:  PRILOSEC Take one capsule every morning at least 30 minutes before first dose of Carafate. What changed:    how much to take  how to take this  when to take this  additional instructions      Follow-up Information    Chevis Pretty III, MD Follow up in 2 week(s).   Specialty:  General Surgery Contact information: [address] [place] [address] [phone]           Signed: Chevis Pretty III 04/17/2019, 7:55 AM

## 2020-03-14 ENCOUNTER — Emergency Department (HOSPITAL_BASED_OUTPATIENT_CLINIC_OR_DEPARTMENT_OTHER): Payer: 59

## 2020-03-14 ENCOUNTER — Other Ambulatory Visit: Payer: Self-pay

## 2020-03-14 ENCOUNTER — Emergency Department (HOSPITAL_BASED_OUTPATIENT_CLINIC_OR_DEPARTMENT_OTHER)
Admission: EM | Admit: 2020-03-14 | Discharge: 2020-03-14 | Disposition: A | Discharge status: Home / Self Care | Payer: 59 | Attending: Emergency Medicine | Admitting: Emergency Medicine

## 2020-03-14 ENCOUNTER — Encounter (HOSPITAL_BASED_OUTPATIENT_CLINIC_OR_DEPARTMENT_OTHER): Payer: Self-pay

## 2020-03-14 DIAGNOSIS — R0602 Shortness of breath: Secondary | ICD-10-CM | POA: Insufficient documentation

## 2020-03-14 DIAGNOSIS — R002 Palpitations: Secondary | ICD-10-CM | POA: Diagnosis not present

## 2020-03-14 DIAGNOSIS — Z8616 Personal history of COVID-19: Secondary | ICD-10-CM | POA: Diagnosis not present

## 2020-03-14 DIAGNOSIS — R0789 Other chest pain: Secondary | ICD-10-CM | POA: Diagnosis not present

## 2020-03-14 DIAGNOSIS — R1013 Epigastric pain: Secondary | ICD-10-CM | POA: Insufficient documentation

## 2020-03-14 DIAGNOSIS — R000 Tachycardia, unspecified: Secondary | ICD-10-CM | POA: Diagnosis not present

## 2020-03-14 DIAGNOSIS — R110 Nausea: Secondary | ICD-10-CM | POA: Insufficient documentation

## 2020-03-14 HISTORY — DX: Infectious mononucleosis, unspecified without complication: B27.90

## 2020-03-14 LAB — COMPREHENSIVE METABOLIC PANEL
ALT: 31 U/L (ref 0–44)
AST: 25 U/L (ref 15–41)
Albumin: 4.4 g/dL (ref 3.5–5.0)
Alkaline Phosphatase: 54 U/L (ref 38–126)
Anion gap: 10 (ref 5–15)
BUN: 10 mg/dL (ref 6–20)
CO2: 25 mmol/L (ref 22–32)
Calcium: 10 mg/dL (ref 8.9–10.3)
Chloride: 103 mmol/L (ref 98–111)
Creatinine, Ser: 0.87 mg/dL (ref 0.44–1.00)
GFR calc Af Amer: >60 mL/min (ref >60)
GFR calc non Af Amer: >60 mL/min (ref >60)
Glucose, Bld: 80 mg/dL (ref 70–99)
Potassium: 3.2 mmol/L — ABNORMAL LOW (ref 3.5–5.1)
Sodium: 138 mmol/L (ref 135–145)
Total Bilirubin: 0.6 mg/dL (ref 0.3–1.2)
Total Protein: 8 g/dL (ref 6.5–8.1)

## 2020-03-14 LAB — PREGNANCY, URINE: Preg Test, Ur: NEGATIVE

## 2020-03-14 LAB — CBC WITH DIFFERENTIAL/PLATELET
Abs Immature Granulocytes: 0.03 10*3/uL (ref 0.00–0.07)
Basophils Absolute: 0 10*3/uL (ref 0.0–0.1)
Basophils Relative: 0 %
Eosinophils Absolute: 0.2 10*3/uL (ref 0.0–0.5)
Eosinophils Relative: 2 %
HCT: 44.6 % (ref 36.0–46.0)
Hemoglobin: 15.2 g/dL — ABNORMAL HIGH (ref 12.0–15.0)
Immature Granulocytes: 0 %
Lymphocytes Relative: 22 %
Lymphs Abs: 1.8 10*3/uL (ref 0.7–4.0)
MCH: 32.6 pg (ref 26.0–34.0)
MCHC: 34.1 g/dL (ref 30.0–36.0)
MCV: 95.7 fL (ref 80.0–100.0)
Monocytes Absolute: 0.5 10*3/uL (ref 0.1–1.0)
Monocytes Relative: 7 %
Neutro Abs: 5.6 10*3/uL (ref 1.7–7.7)
Neutrophils Relative %: 69 %
Platelets: 211 10*3/uL (ref 150–400)
RBC: 4.66 MIL/uL (ref 3.87–5.11)
RDW: 12.4 % (ref 11.5–15.5)
WBC: 8.1 10*3/uL (ref 4.0–10.5)
nRBC: 0 % (ref 0.0–0.2)

## 2020-03-14 LAB — URINALYSIS, ROUTINE W REFLEX MICROSCOPIC
Bilirubin Urine: NEGATIVE
Glucose, UA: NEGATIVE mg/dL
Ketones, ur: NEGATIVE mg/dL
Leukocytes,Ua: NEGATIVE
Nitrite: NEGATIVE
Protein, ur: NEGATIVE mg/dL
Specific Gravity, Urine: 1.015 (ref 1.005–1.030)
pH: 5.5 (ref 5.0–8.0)

## 2020-03-14 LAB — URINALYSIS, MICROSCOPIC (REFLEX)

## 2020-03-14 LAB — TROPONIN I (HIGH SENSITIVITY)
Troponin I (High Sensitivity): <2 ng/L (ref <18)
Troponin I (High Sensitivity): <2 ng/L (ref <18)

## 2020-03-14 LAB — D-DIMER, QUANTITATIVE: D-Dimer, Quant: 0.49 ug/mL-FEU (ref 0.00–0.50)

## 2020-03-14 LAB — LIPASE, BLOOD: Lipase: 30 U/L (ref 11–51)

## 2020-03-14 MED ORDER — ONDANSETRON 4 MG PO TBDP: ORAL_TABLET | ORAL | 0 refills | Status: DC

## 2020-03-14 MED ORDER — IOHEXOL 300 MG/ML  SOLN
100.0000 mL | Freq: Once | INTRAMUSCULAR | Status: AC | PRN
  Administered 2020-03-14: 100 mL via INTRAVENOUS

## 2020-03-14 MED ORDER — SODIUM CHLORIDE 0.9 % IV BOLUS
1000.0000 mL | Freq: Once | INTRAVENOUS | Status: AC
  Administered 2020-03-14: 1000 mL via INTRAVENOUS

## 2020-03-14 MED ORDER — ONDANSETRON HCL 4 MG/2ML IJ SOLN
4.0000 mg | Freq: Once | INTRAMUSCULAR | Status: AC
  Administered 2020-03-14: 4 mg via INTRAVENOUS
  Filled 2020-03-14: qty 2

## 2020-03-14 NOTE — Progress Notes (Signed)
Cardiology Office Note:   Date:  03/15/2020  NAME:  Autumn Stephens    MRN: [id number] DOB:  29-Dec-1990   PCP:  Bridgette Habermann, NP  Cardiologist:  No primary care provider on file.   Referring MD: No ref. provider found   Chief Complaint  Patient presents with  . Tachycardia   History of Present Illness:   Autumn Stephens is a 29 y.o. female with a hx of palpitations who is being seen today for the evaluation of tachycardia at the request of No ref. provider found.  She presents for the evaluation of palpitations.  She reports a long history nearly 10 years of having episodes of tachycardia that occurred during pregnancy.  She is dealt with these on and off for the better part of a decade.  Her primary care physician has her on metoprolol.  She reports since January symptoms are worsening.  She does get daily episodes where she feels her heart racing.  Mainly occur with exercise or activity.  Also can occur with position change.  She also reports recent coronavirus infection as well as possible Epstein-Barr infection.  She is also noticed that her vitamin D was low.  Thyroid studies by her primary care physician in January show a TSH of 2.3.  Her most recent hemoglobin is 15.2.  Total cholesterol 236, triglycerides 246, HDL 53, LDL 139.  She reports that she could work on better hydration.  She is not anemic.  No other significant medical problems for which she takes chronic medications.  She has had 3 pregnancies in the past.  Not currently pregnant to her knowledge.  Pregnancy test yesterday in [facility] was negative.  Troponins yesterday were negative as well.  EKG sinus arrhythmia without any significant changes.  Denies chest pain, shortness of breath, palpitations today.  She reports that she can get occasional chest tightness that occurs with palpitations or at any time.  She describes no significant stress other than what would be expected for the mother of 3.  She does work full-time as well.   No depression reported.  No excess caffeine consumption or alcohol use reported.  Past Medical History: Past Medical History:  Diagnosis Date  . Diffuse cystic mastopathy   . Dysrhythmia    tachycardia on atenol last pregnancy  . Epstein-Barr infection   . H/O varicella   . Irritable bowel syndrome   . Maternal anemia complicating pregnancy, childbirth, or the puerperium 09/28/2012  . Migraine, unspecified, without mention of intractable migraine without mention of status migrainosus   . NSVD (normal spontaneous vaginal delivery - 11/5) 09/28/2012  . Other malaise and fatigue   . Perineal laceration, first degree 09/28/2012  . Postpartum care following vaginal delivery 09/28/2012  . Pregnancy induced hypertension   . Pyelonephritis   . Rubella nonimmune status, delivered, current hospitalization 09/28/2012  . Tachycardia     Past Surgical History: Past Surgical History:  Procedure Laterality Date  . CHOLECYSTECTOMY N/A 04/16/2019   Procedure: LAPAROSCOPIC CHOLECYSTECTOMY WITH INTRAOPERATIVE CHOLANGIOGRAM;  Surgeon: Chevis Pretty III, MD;  Location: [facility];  Service: General;  Laterality: N/A;  . NO PAST SURGERIES      Current Medications: Current Meds  Medication Sig  . metoprolol succinate (TOPROL-XL) 25 MG 24 hr tablet   . metoprolol succinate (TOPROL-XL) 50 MG 24 hr tablet      Allergies:    Augmentin [amoxicillin-pot clavulanate], Biaxin [clarithromycin], and Moxifloxacin   Social History: Social History   Socioeconomic History  .  Marital status: Married    Spouse name: Not on file  . Number of children: 3  . Years of education: Not on file  . Highest education level: Not on file  Occupational History  . Not on file  Tobacco Use  . Smoking status: Never Smoker  . Smokeless tobacco: Never Used  Substance and Sexual Activity  . Alcohol use: No  . Drug use: No  . Sexual activity: Not on file  Other Topics Concern  . Not on file  Social History Narrative  . Not on  file   Social Determinants of Health   Financial Resource Strain:   . Difficulty of Paying Living Expenses:   Food Insecurity:   . Worried About Charity fundraiser in the Last Year:   . Arboriculturist in the Last Year:   Transportation Needs:   . Film/video editor (Medical):   Marland Kitchen Lack of Transportation (Non-Medical):   Physical Activity:   . Days of Exercise per Week:   . Minutes of Exercise per Session:   Stress:   . Feeling of Stress :   Social Connections:   . Frequency of Communication with Friends and Family:   . Frequency of Social Gatherings with Friends and Family:   . Attends Religious Services:   . Active Member of Clubs or Organizations:   . Attends Archivist Meetings:   Marland Kitchen Marital Status:      Family History: The patient's family history includes Cancer in her maternal grandfather and mother; Diabetes in her maternal grandfather; Hyperlipidemia in her mother; Rheum arthritis in her father.  ROS:   All other ROS reviewed and negative. Pertinent positives noted in the HPI.     EKGs/Labs/Other Studies Reviewed:   The following studies were personally reviewed by me today:  EKG:  EKG is  ordered today.  The ekg ordered today demonstrates normal sinus rhythm, heart rate 69, no acute ST-T changes, no evidence for infarction, and was personally reviewed by me.   Recent Labs: 03/14/2020: ALT 31; BUN 10; Creatinine, Ser 0.87; Hemoglobin 15.2; Platelets 211; Potassium 3.2; Sodium 138   Recent Lipid Panel No results found for: CHOL, TRIG, HDL, CHOLHDL, VLDL, LDLCALC, LDLDIRECT  Physical Exam:   VS:  BP 100/72   Pulse 76   Ht 5\' 9"  (1.753 m)   Wt 215 lb 6.4 oz (97.7 kg)   LMP 03/12/2020   SpO2 100%   BMI 31.81 kg/m    Wt Readings from Last 3 Encounters:  03/15/20 215 lb 6.4 oz (97.7 kg)  03/14/20 215 lb (97.5 kg)  04/15/19 215 lb (97.5 kg)    General: Well nourished, well developed, in no acute distress Heart: Atraumatic, normal size  Eyes:  PEERLA, EOMI  Neck: Supple, no JVD Endocrine: No thryomegaly Cardiac: Normal S1, S2; RRR; no murmurs, rubs, or gallops Lungs: Clear to auscultation bilaterally, no wheezing, rhonchi or rales  Abd: Soft, nontender, no hepatomegaly  Ext: No edema, pulses 2+ Musculoskeletal: No deformities, BUE and BLE strength normal and equal Skin: Warm and dry, no rashes   Neuro: Alert and oriented to person, place, time, and situation, CNII-XII grossly intact, no focal deficits  Psych: Normal mood and affect   ASSESSMENT:   Autumn Stephens is a 29 y.o. female who presents for the following: 1. Tachycardia   2. Palpitations     PLAN:   1. Tachycardia 2. Palpitations -Episodes of palpitations and tachycardia for years.  No monitor that I  can see.  She did have a 2D echo a number of years ago that was reported as normal.  EKG shows sinus arrhythmia without any evidence of acute ischemic changes or evidence of prior infarction.  Most recent thyroid studies by her primary care physician show a TSH of 2.3.  Most recent hemoglobin is normal.  She does describe a recent Epstein-Barr infection as well as possible Covid.  This could be pots-like illness where she is having tachycardia with position change.  We need to exclude arrhythmia.  I recommended a 7-day Zio patch.  She is not pregnant.  We will continue the metoprolol that was performed by her primary care physician.  We will see if her monitor shows anything to determine if we need to change plans.  I have encouraged adequate hydration as well as exercise.  I encouraged her to pursue things like supine exercise such as stationary bike or rowing.  She will look into this.  We will see how she does.  We will also plan to have her recheck a lipid panel fasting in the next few months.   Disposition: Return in about 2 months (around 05/15/2020).  Medication Adjustments/Labs and Tests Ordered: Current medicines are reviewed at length with the patient today.   Concerns regarding medicines are outlined above.  Orders Placed This Encounter  Procedures  . LONG TERM MONITOR (3-14 DAYS)   No orders of the defined types were placed in this encounter.   Patient Instructions  Medication Instructions:  The current medical regimen is effective;  continue present plan and medications.  *If you need a refill on your cardiac medications before your next appointment, please call your pharmacy*    Testing/Procedures: Your physician has recommended that you wear a 7 DAY ZIO-PATCH monitor. The Zio patch cardiac monitor continuously records heart rhythm data for up to 14 days, this is for patients being evaluated for multiple types heart rhythms. For the first 24 hours post application, please avoid getting the Zio monitor wet in the shower or by excessive sweating during exercise. After that, feel free to carry on with regular activities. Keep soaps and lotions away from the ZIO XT Patch.  This will be mailed to you, please expect 7-10 days to receive.         Follow-Up: At [facility], you and your health needs are our priority.  As part of our continuing mission to provide you with exceptional heart care, we have created designated Provider Care Teams.  These Care Teams include your primary Cardiologist (physician) and Advanced Practice Providers (APPs -  Physician Assistants and Nurse Practitioners) who all work together to provide you with the care you need, when you need it.  We recommend signing up for the patient portal called "MyChart".  Sign up information is provided on this After Visit Summary.  MyChart is used to connect with patients for Virtual Visits (Telemedicine).  Patients are able to view lab/test results, encounter notes, upcoming appointments, etc.  Non-urgent messages can be sent to your provider as well.   To learn more about what you can do with MyChart, go to [email or web address].    Your next appointment:   2 month(s)  The  format for your next appointment:   In Person  Provider:   Lennie Odor, MD        Signed, Lenna Gilford. Flora Lipps, MD [facility]  [address], [address] [place], [place] [address] [phone]  03/15/2020 8:40 AM

## 2020-03-14 NOTE — Discharge Instructions (Signed)
Stay hydrated, take Zofran for nausea.  Your labs and CT scan are normal today.  You have normal heart function right now.  See your cardiologist as scheduled tomorrow for palpitations.  Return to ER if you have vomiting, severe chest pain, palpitations, abdominal pain, fever.

## 2020-03-14 NOTE — ED Provider Notes (Signed)
[facility] EMERGENCY DEPARTMENT Provider Note   CSN: [id number] Arrival date & time: 03/14/20  1416     History Chief Complaint  Patient presents with  . Abdominal Pain    Autumn Stephens is a 29 y.o. female history of cholecystitis, Covid a month ago, here presenting with chest pain and palpitations and epigastric pain.  Patient states that for the last week or so, she has been having left-sided chest pain and palpitations.  States that the pain is pleuritic in nature and associated with some shortness of breath.  She states that when she had Covid about a month ago, she only had several days of chills and shortness of breath that resolved.  She states that for the last 3-4 days, she has been having epigastric pain and left upper quadrant pain.  She states that her heart rate routinely goes up to the 120s now despite taking metoprolol at baseline.  She states that she has chronic tachycardia and has been taking metoprolol.  She was on 50 mg but went up to 75 mg for the last 3 days.  She called her primary care doctor and has a scheduled appointment tomorrow with cardiology.   The history is provided by the patient.       Past Medical History:  Diagnosis Date  . Diffuse cystic mastopathy   . Dysrhythmia    tachycardia on atenol last pregnancy  . Epstein-Barr infection   . H/O varicella   . Irritable bowel syndrome   . Maternal anemia complicating pregnancy, childbirth, or the puerperium 09/28/2012  . Migraine, unspecified, without mention of intractable migraine without mention of status migrainosus   . NSVD (normal spontaneous vaginal delivery - 11/5) 09/28/2012  . Other malaise and fatigue   . Perineal laceration, first degree 09/28/2012  . Postpartum care following vaginal delivery 09/28/2012  . Pregnancy induced hypertension   . Pyelonephritis   . Rubella nonimmune status, delivered, current hospitalization 09/28/2012  . Tachycardia     Patient Active Problem List   Diagnosis Date Noted  . Cholecystitis 04/15/2019  . Cholelithiasis 04/15/2019  . Postpartum care following vaginal delivery (4/18) 03/11/2017  . Shoulder dystocia, delivered 03/11/2017  . Second-degree perineal laceration, with delivery 03/11/2017  . Current use of beta blocker 03/10/2017    Past Surgical History:  Procedure Laterality Date  . CHOLECYSTECTOMY N/A 04/16/2019   Procedure: LAPAROSCOPIC CHOLECYSTECTOMY WITH INTRAOPERATIVE CHOLANGIOGRAM;  Surgeon: Chevis Pretty III, MD;  Location: [facility];  Service: General;  Laterality: N/A;  . NO PAST SURGERIES       OB History    Gravida  3   Para  3   Term  3   Preterm  0   AB  0   Living  3     SAB  0   TAB  0   Ectopic  0   Multiple  0   Live Births  3           Family History  Problem Relation Age of Onset  . Cancer Mother        skin  . Rheum arthritis Father   . Diabetes Maternal Grandfather   . Cancer Maternal Grandfather        prostate    Social History   Tobacco Use  . Smoking status: Never Smoker  . Smokeless tobacco: Never Used  Substance Use Topics  . Alcohol use: No  . Drug use: No    Home Medications Prior to Admission medications  Medication Sig Start Date End Date Taking? Authorizing Provider  HYDROcodone-acetaminophen (NORCO/VICODIN) 5-325 MG tablet Take 1-2 tablets by mouth every 6 (six) hours as needed for moderate pain. 04/17/19   Griselda Miner, MD  omeprazole (PRILOSEC) 40 MG capsule Take one capsule every morning at least 30 minutes before first dose of Carafate. Patient taking differently: Take 40 mg by mouth daily.  07/25/17   Molpus, John, MD    Allergies    Augmentin [amoxicillin-pot clavulanate], Biaxin [clarithromycin], and Moxifloxacin  Review of Systems   Review of Systems  Cardiovascular: Positive for palpitations.  Gastrointestinal: Positive for abdominal pain.  All other systems reviewed and are negative.   Physical Exam Updated Vital Signs BP (!) 128/95  (BP Location: Left Arm)   Pulse 93   Temp 98.3 F (36.8 C) (Oral)   Resp 18   Ht 5\' 9"  (1.753 m)   Wt 97.5 kg   LMP 03/12/2020   SpO2 100%   BMI 31.75 kg/m   Physical Exam Vitals and nursing note reviewed.  HENT:     Head: Normocephalic.     Mouth/Throat:     Mouth: Mucous membranes are moist.  Eyes:     Extraocular Movements: Extraocular movements intact.  Cardiovascular:     Rate and Rhythm: Regular rhythm. Tachycardia present.  Pulmonary:     Effort: Pulmonary effort is normal.     Breath sounds: Normal breath sounds.  Abdominal:     General: Abdomen is flat.     Palpations: Abdomen is soft.     Comments: + LUQ tenderness   Skin:    General: Skin is warm.  Neurological:     General: No focal deficit present.     Mental Status: She is alert and oriented to person, place, and time.  Psychiatric:        Mood and Affect: Mood normal.        Behavior: Behavior normal.     ED Results / Procedures / Treatments   Labs (all labs ordered are listed, but only abnormal results are displayed) Labs Reviewed  URINALYSIS, ROUTINE W REFLEX MICROSCOPIC - Abnormal; Notable for the following components:      Result Value   Hgb urine dipstick LARGE (*)    All other components within normal limits  URINALYSIS, MICROSCOPIC (REFLEX) - Abnormal; Notable for the following components:   Bacteria, UA RARE (*)    All other components within normal limits  CBC WITH DIFFERENTIAL/PLATELET - Abnormal; Notable for the following components:   Hemoglobin 15.2 (*)    All other components within normal limits  PREGNANCY, URINE  COMPREHENSIVE METABOLIC PANEL  LIPASE, BLOOD  TROPONIN I (HIGH SENSITIVITY)    EKG EKG Interpretation  Date/Time:  Thursday March 14 2020 14:47:34 EDT Ventricular Rate:  89 PR Interval:    QRS Duration: 80 QT Interval:  335 QTC Calculation: 408 R Axis:   21 Text Interpretation: Sinus rhythm Low voltage, precordial leads No previous ECGs available Confirmed  by 10-01-1993 [phone]) on 03/14/2020 3:05:58 PM   Radiology No results found.  Procedures Procedures (including critical care time)  Medications Ordered in ED Medications  sodium chloride 0.9 % bolus 1,000 mL (1,000 mLs Intravenous New Bag/Given 03/14/20 1528)  ondansetron (ZOFRAN) injection 4 mg (4 mg Intravenous Given 03/14/20 1525)    ED Course  I have reviewed the triage vital signs and the nursing notes.  Pertinent labs & imaging results that were available during my care of the patient  were reviewed by me and considered in my medical decision making (see chart for details).    MDM Rules/Calculators/A&P                      Forest Pruden is a 29 y.o. female is here with chest pain abdominal pain.  Chest pain and palpitation for about a week or so.  Epigastric pain for the last several days.  She is on metoprolol ready and is borderline tachycardic.  Given recent Covid, consider pneumonia versus PE.  Also has mild gastroenteritis symptoms.  Also consider colitis as well.  Will get labs and D-dimer and CT abdomen pelvis.  Additional history obtained:  Previous records obtained and reviewed  Lab Tests:  I Ordered, reviewed, and interpreted labs, which included:  CBC, CMP, Trop, Lipase, UA   Imaging Studies ordered:  I ordered imaging studies which included CT ab/pel, I independently visualized and interpreted imaging which showed no colitis  Medicines ordered:  I ordered medication zofran, IVF  For dehdyration    6:32 PM Her D-dimer is normal and troponin negative x2.  Her CT abdomen pelvis is unremarkable .  Her heart rate is normal .  I think likely she had post Covid syndrome versus gastroenteritis.  Patient tolerated p.o. in the ED.  Will discharge home with Zofran and she has cardiology follow-up tomorrow.      Final Clinical Impression(s) / ED Diagnoses Final diagnoses:  None    Rx / DC Orders ED Discharge Orders    None       Drenda Freeze,  MD 03/14/20 [phone]

## 2020-03-14 NOTE — ED Triage Notes (Addendum)
Pt c/o abd pain, nausea x 4 days-states she had CP x 2 weeks and has appt with cards tomorrow-NAD-steady gait

## 2020-03-15 ENCOUNTER — Telehealth: Payer: Self-pay | Admitting: Radiology

## 2020-03-15 ENCOUNTER — Ambulatory Visit (INDEPENDENT_AMBULATORY_CARE_PROVIDER_SITE_OTHER): Payer: 59 | Admitting: Cardiovascular Disease

## 2020-03-15 ENCOUNTER — Encounter: Payer: Self-pay | Admitting: Cardiovascular Disease

## 2020-03-15 VITALS — BP 100/72 | HR 76 | Ht 69.0 in | Wt 215.4 lb

## 2020-03-15 DIAGNOSIS — R002 Palpitations: Secondary | ICD-10-CM

## 2020-03-15 DIAGNOSIS — R000 Tachycardia, unspecified: Secondary | ICD-10-CM

## 2020-03-15 NOTE — Patient Instructions (Signed)
Medication Instructions:  The current medical regimen is effective;  continue present plan and medications.  *If you need a refill on your cardiac medications before your next appointment, please call your pharmacy*   Testing/Procedures: Your physician has recommended that you wear a 7 DAY ZIO-PATCH monitor. The Zio patch cardiac monitor continuously records heart rhythm data for up to 14 days, this is for patients being evaluated for multiple types heart rhythms. For the first 24 hours post application, please avoid getting the Zio monitor wet in the shower or by excessive sweating during exercise. After that, feel free to carry on with regular activities. Keep soaps and lotions away from the ZIO XT Patch.  This will be mailed to you, please expect 7-10 days to receive.         Follow-Up: At [facility], you and your health needs are our priority.  As part of our continuing mission to provide you with exceptional heart care, we have created designated Provider Care Teams.  These Care Teams include your primary Cardiologist (physician) and Advanced Practice Providers (APPs -  Physician Assistants and Nurse Practitioners) who all work together to provide you with the care you need, when you need it.  We recommend signing up for the patient portal called "MyChart".  Sign up information is provided on this After Visit Summary.  MyChart is used to connect with patients for Virtual Visits (Telemedicine).  Patients are able to view lab/test results, encounter notes, upcoming appointments, etc.  Non-urgent messages can be sent to your provider as well.   To learn more about what you can do with MyChart, go to [email or web address].    Your next appointment:   2 month(s)  The format for your next appointment:   In Person  Provider:   Val Verde O'Neal, MD      

## 2020-03-15 NOTE — Telephone Encounter (Signed)
Enrolled patient for a 7 day Zio monitor to be mailed to patients home.  

## 2020-03-19 ENCOUNTER — Other Ambulatory Visit (INDEPENDENT_AMBULATORY_CARE_PROVIDER_SITE_OTHER): Payer: 59

## 2020-03-19 DIAGNOSIS — R002 Palpitations: Secondary | ICD-10-CM

## 2020-03-26 NOTE — Addendum Note (Signed)
Addended by: Brunetta Genera on: 03/26/2020 03:12 PM   Modules accepted: Orders

## 2020-04-09 ENCOUNTER — Telehealth: Payer: Self-pay | Admitting: Cardiovascular Disease

## 2020-04-09 NOTE — Telephone Encounter (Signed)
Pt calling to report she mailed back monitor about 2 weeks ago and checking on status. Will forward to monitor department.

## 2020-04-09 NOTE — Telephone Encounter (Signed)
We just received results back late this afternoon. Results have been imported into chart and are ready for the doctor to read.

## 2020-04-09 NOTE — Telephone Encounter (Signed)
Follow Up:   Pt is calling to see her [employer] results are ready?

## 2020-04-12 NOTE — Telephone Encounter (Signed)
Will route to MD to see if he has these results, I will be glad to call once I get the results from him.   Thank you!

## 2020-04-13 NOTE — Telephone Encounter (Signed)
Will have results by Monday.   Gerri Spore T. Flora Lipps, MD [facility]  [address], [address] [place], [place] [address] [phone]  9:10 AM

## 2020-04-17 NOTE — Telephone Encounter (Signed)
Sent results via mychart

## 2020-05-06 ENCOUNTER — Telehealth: Payer: Self-pay | Admitting: Cardiology

## 2020-05-06 NOTE — Telephone Encounter (Signed)
I attempted to contact patient to schedule for follow up with Franky Macho per [facility] to be seen on 05/13/20 but patient didn't answer unable to leave VM patient VM was full. Patient still needs to be scheduled for the follow up Franky Macho but does have appointment 05/23/20 with O'Neal already scheduled.

## 2020-05-22 ENCOUNTER — Encounter: Payer: Self-pay | Admitting: Cardiology

## 2020-05-22 ENCOUNTER — Telehealth (INDEPENDENT_AMBULATORY_CARE_PROVIDER_SITE_OTHER): Payer: 59 | Admitting: Cardiology

## 2020-05-22 VITALS — BP 147/86 | HR 111 | Ht 69.0 in

## 2020-05-22 DIAGNOSIS — R0683 Snoring: Secondary | ICD-10-CM

## 2020-05-22 DIAGNOSIS — R002 Palpitations: Secondary | ICD-10-CM | POA: Diagnosis not present

## 2020-05-22 DIAGNOSIS — R5383 Other fatigue: Secondary | ICD-10-CM | POA: Diagnosis not present

## 2020-05-22 DIAGNOSIS — E669 Obesity, unspecified: Secondary | ICD-10-CM

## 2020-05-22 DIAGNOSIS — I441 Atrioventricular block, second degree: Secondary | ICD-10-CM | POA: Insufficient documentation

## 2020-05-22 DIAGNOSIS — A6920 Lyme disease, unspecified: Secondary | ICD-10-CM | POA: Insufficient documentation

## 2020-05-22 NOTE — Progress Notes (Signed)
Virtual Visit via Telephone Note   This visit type was conducted due to national recommendations for restrictions regarding the COVID-19 Pandemic (e.g. social distancing) in an effort to limit this patient's exposure and mitigate transmission in our community.  Due to her co-morbid illnesses, this patient is at least at moderate risk for complications without adequate follow up.  This format is felt to be most appropriate for this patient at this time.  The patient did not have access to video technology/had technical difficulties with video requiring transitioning to audio format only (telephone).  All issues noted in this document were discussed and addressed.  No physical exam could be performed with this format.  Please refer to the patient's chart for her  consent to telehealth for [facility].   The patient was identified using 2 identifiers.  Date:  05/22/2020   ID:  Autumn Stephens, DOB [birth year], MRN [id number]  Patient Location: Home Provider Location: Home  PCP:  Bridgette Habermann, NP  Cardiologist:  Dr Iris Pert Electrophysiologist:  None   Evaluation Performed:  Follow-Up Visit  Chief Complaint:  Fatigue, palpitations  History of Present Illness:    Autumn Stephens is a 29 y.o. 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   She saw Dr. Bufford Buttner in April 2021 with complaints of chronic palpitations and tachycardia.  A monitor was placed.  Her heart rate ranged from 34-180.  She had transient Wenkeback at night.  She had no documented sustained arrhythmias.  She had been on beta-blocker therapy at that time, metoprolol 37.5 twice daily.  Patient contacted [place], she continues to have palpitations.  She she says the metoprolol was not helping and she stopped it.  On further interview she tells me that her husband notes that she snores at night.  She has daytime fatigue.  She is not had syncope or near syncope.  She also tells me she is recently been diagnosed with a  positive Lyme disease titer.  Her PCP is going to follow this up.  The patient does not have symptoms concerning for COVID-19 infection (fever, chills, cough, or new shortness of breath).    Past Medical History:  Diagnosis Date  . Diffuse cystic mastopathy   . Dysrhythmia    tachycardia on atenol last pregnancy  . Epstein-Barr infection   . H/O varicella   . Irritable bowel syndrome   . Maternal anemia complicating pregnancy, childbirth, or the puerperium 09/28/2012  . Migraine, unspecified, without mention of intractable migraine without mention of status migrainosus   . NSVD (normal spontaneous vaginal delivery - 11/5) 09/28/2012  . Other malaise and fatigue   . Perineal laceration, first degree 09/28/2012  . Postpartum care following vaginal delivery 09/28/2012  . Pregnancy induced hypertension   . Pyelonephritis   . Rubella nonimmune status, delivered, current hospitalization 09/28/2012  . Tachycardia    Past Surgical History:  Procedure Laterality Date  . CHOLECYSTECTOMY N/A 04/16/2019   Procedure: LAPAROSCOPIC CHOLECYSTECTOMY WITH INTRAOPERATIVE CHOLANGIOGRAM;  Surgeon: Griselda Miner, MD;  Location: [facility];  Service: General;  Laterality: N/A;  . NO PAST SURGERIES       No outpatient medications have been marked as taking for the 05/22/20 encounter (Telemedicine) with Abelino Derrick, PA-C.     Allergies:   Augmentin [amoxicillin-pot clavulanate], Biaxin [clarithromycin], and Moxifloxacin   Social History   Tobacco Use  . Smoking status: Never Smoker  . Smokeless tobacco: Never Used  Vaping Use  .  Vaping Use: Never used  Substance Use Topics  . Alcohol use: No  . Drug use: No     Family Hx: The patient's family history includes Cancer in her maternal grandfather and mother; Diabetes in her maternal grandfather; Hyperlipidemia in her mother; Rheum arthritis in her father.  ROS:   Please see the history of present illness.    All other systems reviewed and are  negative.   Prior CV studies:   The following studies were reviewed today:  7 day monitor 04/09/2020- Patient had a min HR of 34 bpm (sinus bradycardia, [facility] 03/20/2020 4:23 AM, no symptoms), max HR of 192 bpm (sinus tachycardia), and avg HR of 84 bpm (normal sinus rhythm). Predominant underlying rhythm was Sinus Rhythm. 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 No Isolated SVEs, SVE Couplets, or SVE Triplets were present. Isolated VEs were rare (<1.0%), and no VE Couplets or VE Triplets were present. No atrial fibrillation. There were 24 patient triggered events that coincidedd with normal sinus rhythm to sinus tachycardia (heart rate range 64-181 bpm).    Labs/Other Tests and Data Reviewed:    EKG:  An ECG dated 03/15/2020 was personally reviewed today and demonstrated:  NSR-HR 69  Recent Labs: 03/14/2020: ALT 31; BUN 10; Creatinine, Ser 0.87; Hemoglobin 15.2; Platelets 211; Potassium 3.2; Sodium 138   Recent Lipid Panel No results found for: CHOL, TRIG, HDL, CHOLHDL, LDLCALC, LDLDIRECT  Wt Readings from Last 3 Encounters:  03/15/20 215 lb 6.4 oz (97.7 kg)  03/14/20 215 lb (97.5 kg)  04/15/19 215 lb (97.5 kg)     Objective:    Vital Signs:  BP (!) 147/86   Pulse (!) 111   Ht 5\' 9"  (1.753 m)   BMI 31.81 kg/m    VITAL SIGNS:  reviewed  ASSESSMENT & PLAN:    Palpitations- Pt with tachycardia and bradycardia with documented Wenkeback during sleep. She has obesity, daytime fatigue, and snores- will r/o sleep apnea.   Lyme disease- To be followed up by her PCP  Plan:  Home sleep study.  I also suggested she consider an Apple watch with monitoring capabilities.   COVID-19 Education: The signs and symptoms of COVID-19 were discussed with the patient and how to seek care for testing (follow up with PCP or arrange E-visit).  The importance of social distancing was  discussed today.  Time:   Today, I have spent 20 minutes with the patient with telehealth technology discussing the above problems.     Medication Adjustments/Labs and Tests Ordered: Current medicines are reviewed at length with the patient today.  Concerns regarding medicines are outlined above.   Tests Ordered: No orders of the defined types were placed in this encounter.   Medication Changes: No orders of the defined types were placed in this encounter.   Follow Up:  Either In Person or Virtual after sleep study  Signed, , PA-C  05/22/2020 10:59 AM    [facility]

## 2020-05-22 NOTE — Patient Instructions (Signed)
Medication Instructions:  Your physician recommends that you continue on your current medications as directed. Please refer to the Current Medication list given to you today.  *If you need a refill on your cardiac medications before your next appointment, please call your pharmacy*   Testing/Procedures: Your physician has recommended that you have a home sleep study. This test records several body functions during sleep, including: brain activity, eye movement, oxygen and carbon dioxide blood levels, heart rate and rhythm, breathing rate and rhythm, the flow of air through your mouth and nose, snoring, body muscle movements, and chest and belly movement.   Follow-Up: At [facility], you and your health needs are our priority.  As part of our continuing mission to provide you with exceptional heart care, we have created designated Provider Care Teams.  These Care Teams include your primary Cardiologist (physician) and Advanced Practice Providers (APPs -  Physician Assistants and Nurse Practitioners) who all work together to provide you with the care you need, when you need it.  We recommend signing up for the patient portal called "MyChart".  Sign up information is provided on this After Visit Summary.  MyChart is used to connect with patients for Virtual Visits (Telemedicine).  Patients are able to view lab/test results, encounter notes, upcoming appointments, etc.  Non-urgent messages can be sent to your provider as well.   To learn more about what you can do with MyChart, go to [email or web address].    Your next appointment:   After sleep study  The format for your next appointment:   Virtual Visit   Provider:   Corine Shelter, PA-C   Other Instructions Please call to schedule your virtual visit after you have completed your sleep study. Our sleep coordinator, Burna Mortimer, will call you to set this up.

## 2020-05-23 ENCOUNTER — Ambulatory Visit: Payer: 59 | Admitting: Cardiovascular Disease

## 2020-06-26 ENCOUNTER — Ambulatory Visit (HOSPITAL_BASED_OUTPATIENT_CLINIC_OR_DEPARTMENT_OTHER): Payer: 59 | Attending: Cardiology | Admitting: Cardiovascular Disease

## 2020-07-05 ENCOUNTER — Encounter (HOSPITAL_BASED_OUTPATIENT_CLINIC_OR_DEPARTMENT_OTHER): Payer: 59 | Admitting: Cardiovascular Disease

## 2020-08-01 ENCOUNTER — Encounter (HOSPITAL_BASED_OUTPATIENT_CLINIC_OR_DEPARTMENT_OTHER): Payer: Self-pay | Admitting: *Deleted

## 2020-08-01 ENCOUNTER — Emergency Department (HOSPITAL_BASED_OUTPATIENT_CLINIC_OR_DEPARTMENT_OTHER)
Admission: EM | Admit: 2020-08-01 | Discharge: 2020-08-01 | Disposition: A | Discharge status: Home / Self Care | Payer: 59 | Attending: Emergency Medicine | Admitting: Emergency Medicine

## 2020-08-01 ENCOUNTER — Other Ambulatory Visit: Payer: Self-pay

## 2020-08-01 DIAGNOSIS — E669 Obesity, unspecified: Secondary | ICD-10-CM | POA: Insufficient documentation

## 2020-08-01 DIAGNOSIS — R519 Headache, unspecified: Secondary | ICD-10-CM | POA: Insufficient documentation

## 2020-08-01 DIAGNOSIS — M62838 Other muscle spasm: Secondary | ICD-10-CM

## 2020-08-01 DIAGNOSIS — M62830 Muscle spasm of back: Secondary | ICD-10-CM | POA: Insufficient documentation

## 2020-08-01 DIAGNOSIS — M542 Cervicalgia: Secondary | ICD-10-CM

## 2020-08-01 MED ORDER — METHOCARBAMOL 500 MG PO TABS
500.0000 mg | ORAL_TABLET | Freq: Once | ORAL | Status: DC
  Filled 2020-08-01: qty 1

## 2020-08-01 MED ORDER — PREDNISONE 50 MG PO TABS
60.0000 mg | ORAL_TABLET | Freq: Once | ORAL | Status: AC
  Administered 2020-08-01: 60 mg via ORAL
  Filled 2020-08-01: qty 1

## 2020-08-01 MED ORDER — PREDNISONE 20 MG PO TABS: 60.0000 mg | ORAL_TABLET | Freq: Every day | ORAL | 0 refills | Status: AC

## 2020-08-01 MED ORDER — METHOCARBAMOL 500 MG PO TABS: 500.0000 mg | ORAL_TABLET | Freq: Two times a day (BID) | ORAL | 0 refills | Status: DC

## 2020-08-01 MED ORDER — LIDOCAINE 5 % EX PTCH
1.0000 | MEDICATED_PATCH | CUTANEOUS | Status: DC
  Administered 2020-08-01: 1 via TRANSDERMAL
  Filled 2020-08-01: qty 1

## 2020-08-01 NOTE — Discharge Instructions (Signed)
I suspect your symptoms are due to muscle spasm and inflammation.  Take 5-day burst of steroids starting tomorrow use prescribed muscle relaxers as well as continued over-the-counter therapies, you can also use salon pas lidocaine patches.  These should be applied for 12 hours and then remove for 12 hours.  If symptoms or not improving please follow-up with your primary care doctor.

## 2020-08-01 NOTE — ED Triage Notes (Addendum)
Pt c/o neck pain which radiates down to right shoulder  x 16 days , blurred vision x 10 days, denies injury, increased pain with movt

## 2020-08-01 NOTE — ED Provider Notes (Signed)
[facility] EMERGENCY DEPARTMENT Provider Note   CSN: [id number] Arrival date & time: 08/01/20  1350     History Chief Complaint  Patient presents with  . Neck Pain    Autumn Stephens is a 29 y.o. female.  Autumn Stephens is a 29 y.o. female with a history of migraines, IBS, Lyme disease, who presents to the emergency department for evaluation of neck pain.  Pain has been present for just over 2 weeks.  It initially was localized to the right side of her neck and she thought she had strained it, but states that pain has worsened and never completely resolved.  She states now it is present on both sides of her neck and is worse with movement.  She has not noticed any skin changes or swelling over the neck.  She denies any numbness tingling or weakness in her extremities.  States that she has had occasional headaches but has a history of migraine.  She also noticed a few days before her neck pain began that she occasionally has a brief flash of a blurry area of her vision that only lasts a second and then goes away in both eyes and is not sure if this is related.  She is not sure if she is experience this prior to this.  No associated fevers or chills.  No sore throat or difficulty breathing or swallowing.  She has not noted any swollen lymph nodes.  No chest pain or shortness of breath, no nausea, vomiting or abdominal pain.  She has been taking ibuprofen and Tylenol regularly without much improvement, she states she does get some mild relief with ibuprofen.  No other aggravating or alleviating factors.        Past Medical History:  Diagnosis Date  . Diffuse cystic mastopathy   . Dysrhythmia    tachycardia on atenol last pregnancy  . Epstein-Barr infection   . H/O varicella   . Irritable bowel syndrome   . Maternal anemia complicating pregnancy, childbirth, or the puerperium 09/28/2012  . Migraine, unspecified, without mention of intractable migraine without mention of status  migrainosus   . NSVD (normal spontaneous vaginal delivery - 11/5) 09/28/2012  . Other malaise and fatigue   . Perineal laceration, first degree 09/28/2012  . Postpartum care following vaginal delivery 09/28/2012  . Pregnancy induced hypertension   . Pyelonephritis   . Rubella nonimmune status, delivered, current hospitalization 09/28/2012  . Tachycardia     Patient Active Problem List   Diagnosis Date Noted  . Palpitations 05/22/2020  . 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 Obesity (BMI 30.0-34.9) 05/22/2020  . Lyme disease 05/22/2020  . Snoring 05/22/2020  . Cholecystitis 04/15/2019  . Cholelithiasis 04/15/2019  . Postpartum care following vaginal delivery (4/18) 03/11/2017  . Shoulder dystocia, delivered 03/11/2017  . Second-degree perineal laceration, with delivery 03/11/2017  . Current use of beta blocker 03/10/2017    Past Surgical History:  Procedure Laterality Date  . CHOLECYSTECTOMY N/A 04/16/2019   Procedure: LAPAROSCOPIC CHOLECYSTECTOMY WITH INTRAOPERATIVE CHOLANGIOGRAM;  Surgeon: Chevis Pretty III, MD;  Location: [facility];  Service: General;  Laterality: N/A;  . NO PAST SURGERIES       OB History    Gravida  3   Para  3   Term  3   Preterm  0   AB  0   Living  3     SAB  0   TAB  0   Ectopic  0   Multiple  0  Live Births  3           Family History  Problem Relation Age of Onset  . Cancer Mother        skin  . Hyperlipidemia Mother   . Rheum arthritis Father   . Diabetes Maternal Grandfather   . Cancer Maternal Grandfather        prostate    Social History   Tobacco Use  . Smoking status: Never Smoker  . Smokeless tobacco: Never Used  Vaping Use  . Vaping Use: Never used  Substance Use Topics  . Alcohol use: No  . Drug use: No    Home Medications Prior to Admission medications   Medication Sig Start Date End Date Taking? Authorizing Provider  methocarbamol (ROBAXIN) 500 MG tablet Take 1 tablet (500 mg total) by mouth 2 (two) times daily.  08/01/20   Dartha Lodge, PA-C  predniSONE (DELTASONE) 20 MG tablet Take 3 tablets (60 mg total) by mouth daily for 5 days. 08/01/20 08/06/20  Dartha Lodge, PA-C    Allergies    Augmentin [amoxicillin-pot clavulanate], Biaxin [clarithromycin], and Moxifloxacin  Review of Systems   Review of Systems  Constitutional: Negative for chills and fever.  Eyes: Negative for visual disturbance.  Gastrointestinal: Negative for nausea and vomiting.  Musculoskeletal: Positive for neck pain and neck stiffness. Negative for arthralgias and myalgias.  Skin: Negative for color change and rash.  Neurological: Positive for headaches. Negative for dizziness, syncope, weakness, light-headedness and numbness.    Physical Exam Updated Vital Signs BP (!) 158/108   Pulse 88   Temp 98.4 F (36.9 C)   Resp 18   Ht 5\' 9"  (1.753 m)   Wt 97.5 kg   LMP 07/15/2020   SpO2 100%   BMI 31.75 kg/m   Physical Exam Vitals and nursing note reviewed.  Constitutional:      General: She is not in acute distress.    Appearance: Normal appearance. She is well-developed. She is obese. She is not ill-appearing or diaphoretic.  HENT:     Head: Normocephalic and atraumatic.  Eyes:     General:        Right eye: No discharge.        Left eye: No discharge.     Extraocular Movements: Extraocular movements intact.     Conjunctiva/sclera: Conjunctivae normal.     Pupils: Pupils are equal, round, and reactive to light.  Neck:     Comments: Diffuse tenderness over neck musculature bilaterally without palpable deformity or overlying skin changes, no focal midline spinal tenderness.  Range of motion decreased due to pain.  No anterior neck tenderness or stridor on auscultation, no palpable lymphadenopathy Cardiovascular:     Rate and Rhythm: Normal rate and regular rhythm.     Pulses: Normal pulses.     Heart sounds: Normal heart sounds. No murmur heard.  No friction rub. No gallop.   Pulmonary:     Effort: Pulmonary  effort is normal. No respiratory distress.     Breath sounds: Normal breath sounds. No wheezing or rales.     Comments: Respirations equal and unlabored, patient able to speak in full sentences, lungs clear to auscultation bilaterally Abdominal:     General: Bowel sounds are normal. There is no distension.     Palpations: Abdomen is soft. There is no mass.     Tenderness: There is no abdominal tenderness. There is no guarding.  Musculoskeletal:        General:  No deformity.     Cervical back: Neck supple. Tenderness present.  Skin:    General: Skin is warm and dry.     Capillary Refill: Capillary refill takes less than 2 seconds.  Neurological:     General: No focal deficit present.     Mental Status: She is alert and oriented to person, place, and time.     Coordination: Coordination normal.     Comments: Speech is clear, able to follow commands CN III-XII intact Normal strength in upper and lower extremities bilaterally including dorsiflexion and plantar flexion, strong and equal grip strength Sensation normal to light and sharp touch Moves extremities without ataxia, coordination intact  Psychiatric:        Mood and Affect: Mood normal.        Behavior: Behavior normal.     ED Results / Procedures / Treatments   Labs (all labs ordered are listed, but only abnormal results are displayed) Labs Reviewed - No data to display  EKG None  Radiology No results found.  Procedures Procedures (including critical care time)  Medications Ordered in ED Medications  lidocaine (LIDODERM) 5 % 1 patch (1 patch Transdermal Patch Applied 08/01/20 1625)  methocarbamol (ROBAXIN) tablet 500 mg (500 mg Oral Not Given 08/01/20 1643)  predniSONE (DELTASONE) tablet 60 mg (60 mg Oral Given 08/01/20 1624)    ED Course  I have reviewed the triage vital signs and the nursing notes.  Pertinent labs & imaging results that were available during my care of the patient were reviewed by me and considered  in my medical decision making (see chart for details).    MDM Rules/Calculators/A&P                          29 year old female presents with 2 weeks of neck pain initially started as what she thought was a strain on the right side but has now worsened and is diffusely present throughout her neck, she has been taking ibuprofen and Tylenol without improvement.  No associated fevers, numbness tingling or weakness.  She reports some intermittent flashes of blurred vision which began slightly before the neck pain, do not feel that these are necessarily related not asked her to follow-up with her eye doctor regarding this.  On exam she is diffusely tender over the neck musculature with palpable spasm bilaterally and I feel this is likely the cause of her pain.  Given reassuring neurologic exam myself and Dr. Pilar Plate do not feel there is need for emergent imaging, will treat with steroids for better control of inflammation as well as muscle relaxers encouraged use of lidocaine patches, ice and heat as well and close follow-up with PCP if symptoms or not improving.  Patient discussed with Dr. Pilar Plate, who saw patient as well and agrees with plan.  Final Clinical Impression(s) / ED Diagnoses Final diagnoses:  Neck pain  Muscle spasms of neck    Rx / DC Orders ED Discharge Orders         Ordered    predniSONE (DELTASONE) 20 MG tablet  Daily        08/01/20 1650    methocarbamol (ROBAXIN) 500 MG tablet  2 times daily        08/01/20 1650           Jodi Geralds [place], [place] 08/05/20 1141    Sabas Sous, MD 08/05/20 1627

## 2020-08-01 NOTE — ED Notes (Signed)
ED Provider at bedside. 

## 2020-11-23 NOTE — L&D Delivery Note (Addendum)
Delivery Note At 3:30 PM a viable and healthy female was delivered via Vaginal, Spontaneous (Presentation:   Occiput Anterior).  APGAR: 8, 9; weight  pending.   Placenta status: Spontaneous, Intact.  Cord: 3 vessels with the following complications: None.  Cord pH: na  Anesthesia: Epidural Episiotomy: None Lacerations: 1st degree Suture Repair: 3.0 vicryl rapide Est. Blood Loss (mL): 100  Mom to postpartum.  Baby to Couplet care / Skin to Skin. History of PP pyelo and pp uti x 2. Will put on keflex x 7d [place] [facility] J 10/19/2021, 3:42 PM

## 2020-12-16 ENCOUNTER — Emergency Department (HOSPITAL_BASED_OUTPATIENT_CLINIC_OR_DEPARTMENT_OTHER)
Admission: EM | Admit: 2020-12-16 | Discharge: 2020-12-16 | Disposition: A | Discharge status: Home / Self Care | Payer: 59 | Attending: Emergency Medicine | Admitting: Emergency Medicine

## 2020-12-16 ENCOUNTER — Other Ambulatory Visit: Payer: Self-pay

## 2020-12-16 ENCOUNTER — Encounter (HOSPITAL_BASED_OUTPATIENT_CLINIC_OR_DEPARTMENT_OTHER): Payer: Self-pay | Admitting: Emergency Medicine

## 2020-12-16 ENCOUNTER — Emergency Department (HOSPITAL_BASED_OUTPATIENT_CLINIC_OR_DEPARTMENT_OTHER): Payer: 59

## 2020-12-16 DIAGNOSIS — R1030 Lower abdominal pain, unspecified: Secondary | ICD-10-CM | POA: Insufficient documentation

## 2020-12-16 DIAGNOSIS — M549 Dorsalgia, unspecified: Secondary | ICD-10-CM | POA: Diagnosis not present

## 2020-12-16 DIAGNOSIS — R109 Unspecified abdominal pain: Secondary | ICD-10-CM

## 2020-12-16 LAB — CBC WITH DIFFERENTIAL/PLATELET
Abs Immature Granulocytes: 0.03 10*3/uL (ref 0.00–0.07)
Basophils Absolute: 0 10*3/uL (ref 0.0–0.1)
Basophils Relative: 1 %
Eosinophils Absolute: 0.1 10*3/uL (ref 0.0–0.5)
Eosinophils Relative: 1 %
HCT: 42.7 % (ref 36.0–46.0)
Hemoglobin: 14.9 g/dL (ref 12.0–15.0)
Immature Granulocytes: 1 %
Lymphocytes Relative: 25 %
Lymphs Abs: 1.6 10*3/uL (ref 0.7–4.0)
MCH: 32.5 pg (ref 26.0–34.0)
MCHC: 34.9 g/dL (ref 30.0–36.0)
MCV: 93 fL (ref 80.0–100.0)
Monocytes Absolute: 0.4 10*3/uL (ref 0.1–1.0)
Monocytes Relative: 7 %
Neutro Abs: 4.4 10*3/uL (ref 1.7–7.7)
Neutrophils Relative %: 65 %
Platelets: 225 10*3/uL (ref 150–400)
RBC: 4.59 MIL/uL (ref 3.87–5.11)
RDW: 12.4 % (ref 11.5–15.5)
WBC: 6.5 10*3/uL (ref 4.0–10.5)
nRBC: 0 % (ref 0.0–0.2)

## 2020-12-16 LAB — URINALYSIS, ROUTINE W REFLEX MICROSCOPIC
Bilirubin Urine: NEGATIVE
Glucose, UA: NEGATIVE mg/dL
Hgb urine dipstick: NEGATIVE
Ketones, ur: NEGATIVE mg/dL
Leukocytes,Ua: NEGATIVE
Nitrite: NEGATIVE
Protein, ur: NEGATIVE mg/dL
Specific Gravity, Urine: 1.03 (ref 1.005–1.030)
pH: 5 (ref 5.0–8.0)

## 2020-12-16 LAB — COMPREHENSIVE METABOLIC PANEL
ALT: 32 U/L (ref 0–44)
AST: 28 U/L (ref 15–41)
Albumin: 4.2 g/dL (ref 3.5–5.0)
Alkaline Phosphatase: 55 U/L (ref 38–126)
Anion gap: 10 (ref 5–15)
BUN: 12 mg/dL (ref 6–20)
CO2: 24 mmol/L (ref 22–32)
Calcium: 9.3 mg/dL (ref 8.9–10.3)
Chloride: 106 mmol/L (ref 98–111)
Creatinine, Ser: 0.97 mg/dL (ref 0.44–1.00)
GFR, Estimated: >60 mL/min (ref >60)
Glucose, Bld: 93 mg/dL (ref 70–99)
Potassium: 4.1 mmol/L (ref 3.5–5.1)
Sodium: 140 mmol/L (ref 135–145)
Total Bilirubin: 1.2 mg/dL (ref 0.3–1.2)
Total Protein: 7.5 g/dL (ref 6.5–8.1)

## 2020-12-16 LAB — LIPASE, BLOOD: Lipase: 31 U/L (ref 11–51)

## 2020-12-16 LAB — PREGNANCY, URINE: Preg Test, Ur: NEGATIVE

## 2020-12-16 MED ORDER — ONDANSETRON HCL 4 MG/2ML IJ SOLN
4.0000 mg | Freq: Once | INTRAMUSCULAR | Status: AC
  Administered 2020-12-16: 4 mg via INTRAVENOUS
  Filled 2020-12-16: qty 2

## 2020-12-16 MED ORDER — MORPHINE SULFATE (PF) 4 MG/ML IV SOLN
4.0000 mg | Freq: Once | INTRAVENOUS | Status: DC
  Filled 2020-12-16: qty 1

## 2020-12-16 NOTE — ED Provider Notes (Signed)
[facility] EMERGENCY DEPARTMENT Provider Note   CSN: [id number] Arrival date & time: 12/16/20  0600     History Chief Complaint  Patient presents with  . Abdominal Pain  . Back Pain    Autumn Stephens is a 30 y.o. female.  HPI     This is a 30 year old female with a history of irritable bowel syndrome, migraine, pyelonephritis who presents with urinary symptoms, abdominal pain, right-sided flank pain.  Patient reports 1 to 2 weeks of bladder fullness, right-sided flank pain, and abdominal pain.  She denies overt dysuria or hematuria.  She states the pain is on her right side and does not radiate.  It is dull in nature.  It is currently 7 out of 10.  It feels similar to when she had pyelonephritis.  She denies any fevers.  She also states that she has had some abdominal discomfort which is associated with eating.  She states every time she eats she has pain in her lower abdomen.  No associated nausea, vomiting, diarrhea, change in stools.  No known history of kidney stones.  Patient states that she had leftover ciprofloxacin x4 days.  However, she did not see any improvement.  Past Medical History:  Diagnosis Date  . Diffuse cystic mastopathy   . Dysrhythmia    tachycardia on atenol last pregnancy  . Epstein-Barr infection   . H/O varicella   . Irritable bowel syndrome   . Maternal anemia complicating pregnancy, childbirth, or the puerperium 09/28/2012  . Migraine, unspecified, without mention of intractable migraine without mention of status migrainosus   . NSVD (normal spontaneous vaginal delivery - 11/5) 09/28/2012  . Other malaise and fatigue   . Perineal laceration, first degree 09/28/2012  . Postpartum care following vaginal delivery 09/28/2012  . Pregnancy induced hypertension   . Pyelonephritis   . Rubella nonimmune status, delivered, current hospitalization 09/28/2012  . Tachycardia     Patient Active Problem List   Diagnosis Date Noted  . Palpitations  05/22/2020  . 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 Obesity (BMI 30.0-34.9) 05/22/2020  . Lyme disease 05/22/2020  . Snoring 05/22/2020  . Cholecystitis 04/15/2019  . Cholelithiasis 04/15/2019  . Postpartum care following vaginal delivery (4/18) 03/11/2017  . Shoulder dystocia, delivered 03/11/2017  . Second-degree perineal laceration, with delivery 03/11/2017  . Current use of beta blocker 03/10/2017    Past Surgical History:  Procedure Laterality Date  . CHOLECYSTECTOMY N/A 04/16/2019   Procedure: LAPAROSCOPIC CHOLECYSTECTOMY WITH INTRAOPERATIVE CHOLANGIOGRAM;  Surgeon: Chevis Pretty III, MD;  Location: [facility];  Service: General;  Laterality: N/A;  . NO PAST SURGERIES       OB History    Gravida  3   Para  3   Term  3   Preterm  0   AB  0   Living  3     SAB  0   IAB  0   Ectopic  0   Multiple  0   Live Births  3           Family History  Problem Relation Age of Onset  . Cancer Mother        skin  . Hyperlipidemia Mother   . Rheum arthritis Father   . Diabetes Maternal Grandfather   . Cancer Maternal Grandfather        prostate    Social History   Tobacco Use  . Smoking status: Never Smoker  . Smokeless tobacco: Never Used  Vaping Use  . Vaping  Use: Never used  Substance Use Topics  . Alcohol use: No  . Drug use: No    Home Medications Prior to Admission medications   Medication Sig Start Date End Date Taking? Authorizing Provider  methocarbamol (ROBAXIN) 500 MG tablet Take 1 tablet (500 mg total) by mouth 2 (two) times daily. 08/01/20   Dartha Lodge, PA-C    Allergies    Augmentin [amoxicillin-pot clavulanate], Biaxin [clarithromycin], and Moxifloxacin  Review of Systems   Review of Systems  Constitutional: Negative for fever.  Respiratory: Negative for shortness of breath.   Cardiovascular: Negative for chest pain.  Gastrointestinal: Positive for abdominal pain. Negative for constipation, diarrhea, nausea and vomiting.  Genitourinary: Positive  for flank pain. Negative for difficulty urinating, dysuria, vaginal bleeding and vaginal discharge.       Bladder fullness  All other systems reviewed and are negative.   Physical Exam Updated Vital Signs BP (!) 132/105 (BP Location: Right Arm)   Pulse 79   Temp 97.8 F (36.6 C) (Oral)   Resp 16   Ht 1.753 m (5\' 9" )   Wt 97.5 kg   LMP 12/06/2020   SpO2 100%   BMI 31.74 kg/m   Physical Exam Vitals and nursing note reviewed.  Constitutional:      Appearance: She is well-developed and well-nourished. She is obese. She is not ill-appearing.  HENT:     Head: Normocephalic and atraumatic.     Mouth/Throat:     Mouth: Mucous membranes are moist.  Eyes:     Pupils: Pupils are equal, round, and reactive to light.  Cardiovascular:     Rate and Rhythm: Normal rate and regular rhythm.     Heart sounds: Normal heart sounds.  Pulmonary:     Effort: Pulmonary effort is normal. No respiratory distress.     Breath sounds: No wheezing.  Abdominal:     General: Bowel sounds are normal.     Palpations: Abdomen is soft.     Tenderness: There is no abdominal tenderness. There is right CVA tenderness. There is no guarding or rebound.  Musculoskeletal:     Cervical back: Neck supple.  Skin:    General: Skin is warm and dry.  Neurological:     Mental Status: She is alert and oriented to person, place, and time.  Psychiatric:        Mood and Affect: Mood and affect and mood normal.     ED Results / Procedures / Treatments   Labs (all labs ordered are listed, but only abnormal results are displayed) Labs Reviewed  URINALYSIS, ROUTINE W REFLEX MICROSCOPIC  PREGNANCY, URINE  CBC WITH DIFFERENTIAL/PLATELET  COMPREHENSIVE METABOLIC PANEL  LIPASE, BLOOD    EKG None  Radiology No results found.  Procedures Procedures (including critical care time)  Medications Ordered in ED Medications - No data to display  ED Course  I have reviewed the triage vital signs and the nursing  notes.  Pertinent labs & imaging results that were available during my care of the patient were reviewed by me and considered in my medical decision making (see chart for details).    MDM Rules/Calculators/A&P                          Patient presents with bladder fullness, lower abdominal discomfort, and flank pain.  She is overall nontoxic vital signs are reassuring.  She is afebrile.  Considerations include, pyelonephritis, kidney stone, less likely appendicitis.  She  does have some abdominal discomfort with eating which could be gallbladder related but the location would be atypical.  She does not believe herself to be pregnant.  We will obtain basic labs, urine pregnancy, urinalysis.  Patient will be signed out to oncoming provider  Final Clinical Impression(s) / ED Diagnoses Final diagnoses:  None    Rx / DC Orders ED Discharge Orders    None       Jacqualynn Parco, Mayer Masker, MD 12/16/20 [phone]

## 2020-12-16 NOTE — ED Triage Notes (Signed)
Patient presents with complaints of abd pain, back pain, nausea; bladder fullness; states intermittent x 2 weeks; states took 4 days of cipro with no relief. Denies hematuria or freq or dysuria.

## 2020-12-16 NOTE — ED Notes (Signed)
Patient transported to CT 

## 2020-12-16 NOTE — ED Notes (Signed)
Pt declines pain medication as ordered at this time, states driving herself home.  Reports pain 4/10 manageable at this time, declines further intervention.

## 2020-12-16 NOTE — ED Provider Notes (Signed)
Patient with flank pain.  CT scan showed no kidney stone, no appendicitis.  Urinalysis negative for infection.  Work-up overall unremarkable.  Could be musculoskeletal pain versus recently passed kidney stone.  Discharged in good condition.  Recommend follow-up with primary care doctor.   Virgina Norfolk, DO 12/16/20 [phone]

## 2021-03-30 ENCOUNTER — Inpatient Hospital Stay (HOSPITAL_COMMUNITY)
Admission: AD | Admit: 2021-03-30 | Discharge: 2021-03-30 | Disposition: A | Discharge status: Home / Self Care | Payer: 59 | Attending: Obstetrics | Admitting: Obstetrics

## 2021-03-30 ENCOUNTER — Encounter (HOSPITAL_COMMUNITY): Payer: Self-pay | Admitting: Obstetrics

## 2021-03-30 ENCOUNTER — Other Ambulatory Visit: Payer: Self-pay

## 2021-03-30 DIAGNOSIS — O26851 Spotting complicating pregnancy, first trimester: Secondary | ICD-10-CM | POA: Diagnosis not present

## 2021-03-30 DIAGNOSIS — O209 Hemorrhage in early pregnancy, unspecified: Secondary | ICD-10-CM | POA: Diagnosis not present

## 2021-03-30 DIAGNOSIS — O10011 Pre-existing essential hypertension complicating pregnancy, first trimester: Secondary | ICD-10-CM | POA: Diagnosis not present

## 2021-03-30 DIAGNOSIS — O10911 Unspecified pre-existing hypertension complicating pregnancy, first trimester: Secondary | ICD-10-CM | POA: Insufficient documentation

## 2021-03-30 DIAGNOSIS — O10919 Unspecified pre-existing hypertension complicating pregnancy, unspecified trimester: Secondary | ICD-10-CM

## 2021-03-30 DIAGNOSIS — Z79899 Other long term (current) drug therapy: Secondary | ICD-10-CM | POA: Insufficient documentation

## 2021-03-30 DIAGNOSIS — Z3A10 10 weeks gestation of pregnancy: Secondary | ICD-10-CM | POA: Insufficient documentation

## 2021-03-30 LAB — POCT PREGNANCY, URINE: Preg Test, Ur: POSITIVE — AB

## 2021-03-30 MED ORDER — METOPROLOL SUCCINATE ER 25 MG PO TB24: 50.0000 mg | ORAL_TABLET | Freq: Every day | ORAL | 0 refills | Status: DC

## 2021-03-30 NOTE — MAU Provider Note (Signed)
History     CSN: [id number]  Arrival date and time: 03/30/21 1529   Event Date/Time   First Provider Initiated Contact with Patient 03/30/21 1631      Chief Complaint  Patient presents with  . Hypertension  . Vaginal Bleeding   HPI Autumn Stephens is a 30 y.o. G4P3003 at [redacted]w[redacted]d who presents with spotting and hypertension. She states she saw some light pink spotting yesterday and this am when she wiped. She has not seen any since arrival to MAU. She denies any pain. She also reports new hypertension since Monday. She denies any chest pain, shortness of breath or dizziness. She takes metoprolol for tachycardia in pregnancy and denies any hx of hypertension in pregnancy. She sees Dr. Billy Coast at [facility] and has had 2 ultrasounds confirming IUP in the office  OB History    Gravida  4   Para  3   Term  3   Preterm  0   AB  0   Living  3     SAB  0   IAB  0   Ectopic  0   Multiple  0   Live Births  3           Past Medical History:  Diagnosis Date  . Diffuse cystic mastopathy   . Dysrhythmia    tachycardia on atenol last pregnancy  . Epstein-Barr infection   . H/O varicella   . Irritable bowel syndrome   . Maternal anemia complicating pregnancy, childbirth, or the puerperium 09/28/2012  . Migraine, unspecified, without mention of intractable migraine without mention of status migrainosus   . NSVD (normal spontaneous vaginal delivery - 11/5) 09/28/2012  . Other malaise and fatigue   . Perineal laceration, first degree 09/28/2012  . Postpartum care following vaginal delivery 09/28/2012  . Pregnancy induced hypertension   . Pyelonephritis   . Rubella nonimmune status, delivered, current hospitalization 09/28/2012  . Tachycardia     Past Surgical History:  Procedure Laterality Date  . CHOLECYSTECTOMY N/A 04/16/2019   Procedure: LAPAROSCOPIC CHOLECYSTECTOMY WITH INTRAOPERATIVE CHOLANGIOGRAM;  Surgeon: Griselda Miner, MD;  Location: [facility];  Service: General;   Laterality: N/A;  . NO PAST SURGERIES      Family History  Problem Relation Age of Onset  . Cancer Mother        skin  . Hyperlipidemia Mother   . Rheum arthritis Father   . Diabetes Maternal Grandfather   . Cancer Maternal Grandfather        prostate    Social History   Tobacco Use  . Smoking status: Never Smoker  . Smokeless tobacco: Never Used  Vaping Use  . Vaping Use: Never used  Substance Use Topics  . Alcohol use: No  . Drug use: No    Allergies:  Allergies  Allergen Reactions  . Augmentin [Amoxicillin-Pot Clavulanate] Nausea And Vomiting    Did it involve swelling of the face/tongue/throat, SOB, or low BP? No Did it involve sudden or severe rash/hives, skin peeling, or any reaction on the inside of your mouth or nose? No Did you need to seek medical attention at a hospital or doctor's office? No When did it last happen?"several months ago" If all above answers are "NO", may proceed with cephalosporin use.   . Biaxin [Clarithromycin] Nausea And Vomiting  . Moxifloxacin Nausea And Vomiting    Medications Prior to Admission  Medication Sig Dispense Refill Last Dose  . methocarbamol (ROBAXIN) 500 MG tablet Take 1 tablet (  500 mg total) by mouth 2 (two) times daily. 20 tablet 0     Review of Systems  Constitutional: Negative.  Negative for fatigue and fever.  HENT: Negative.   Respiratory: Negative.  Negative for shortness of breath.   Cardiovascular: Negative.  Negative for chest pain.  Gastrointestinal: Negative.  Negative for abdominal pain, constipation, diarrhea, nausea and vomiting.  Genitourinary: Positive for vaginal bleeding. Negative for dysuria and vaginal discharge.  Neurological: Negative.  Negative for dizziness and headaches.   Physical Exam   Blood pressure (!) 155/99, pulse 91, temperature 98 F (36.7 C), temperature source Oral, resp. rate 16, last menstrual period 12/06/2020, SpO2 100 %.  Patient Vitals for the past 24 hrs:  BP  Temp Temp src Pulse Resp SpO2  03/30/21 1659 -- -- -- -- -- 100 %  03/30/21 1658 (!) 147/87 98.8 F (37.1 C) Oral 90 17 100 %  03/30/21 1654 -- -- -- -- -- 100 %  03/30/21 1649 -- -- -- -- -- 100 %  03/30/21 1644 (!) 156/90 -- -- 94 -- 100 %  03/30/21 1639 -- -- -- -- -- 100 %  03/30/21 1634 -- -- -- -- -- 100 %  03/30/21 1629 -- -- -- -- -- 100 %  03/30/21 1609 (!) 155/99 98 F (36.7 C) Oral 91 16 100 %   Physical Exam Vitals and nursing note reviewed.  Constitutional:      General: She is not in acute distress.    Appearance: She is well-developed.  HENT:     Head: Normocephalic.  Eyes:     Pupils: Pupils are equal, round, and reactive to light.  Cardiovascular:     Rate and Rhythm: Normal rate and regular rhythm.     Heart sounds: Normal heart sounds.  Pulmonary:     Effort: Pulmonary effort is normal. No respiratory distress.     Breath sounds: Normal breath sounds.  Abdominal:     General: Bowel sounds are normal. There is no distension.     Palpations: Abdomen is soft.     Tenderness: There is no abdominal tenderness.  Skin:    General: Skin is warm and dry.  Neurological:     Mental Status: She is alert and oriented to person, place, and time.  Psychiatric:        Behavior: Behavior normal.        Thought Content: Thought content normal.        Judgment: Judgment normal.     MAU Course  Procedures Results for orders placed or performed during the hospital encounter of 03/30/21 (from the past 24 hour(s))  Pregnancy, urine POC     Status: Abnormal   Collection Time: 03/30/21  3:49 PM  Result Value Ref Range   Preg Test, Ur POSITIVE (A) NEGATIVE   MDM Prenatal records from private office not on file. Pregnancy complicated by tachycardia. Labs ordered and reviewed. UPT, UA  Pt informed that the ultrasound is considered a limited OB ultrasound and is not intended to be a complete ultrasound exam.  Patient also informed that the ultrasound is not being  completed with the intent of assessing for fetal or placental anomalies or any pelvic abnormalities.  Explained that the purpose of today's ultrasound is to assess for  viability.  Patient acknowledges the purpose of the exam and the limitations of the study.    FHR of 148bpm and fetal movement seen on ultrasound.  Patient declines pelvic exam and cultures.  Consulted with  Dr. Macon Large- recommends increasing dose of metoprolol to 50mg /day. No labs needed today.   1654- Dr. notified of patient's new onset HTN. Will send message to RN to have BP check this week in the office and patient instructed to call office tomorrow to schedule.   Assessment and Plan   1. Chronic hypertension affecting pregnancy   2. [redacted] weeks gestation of pregnancy   3. Spotting affecting pregnancy in first trimester    -Discharge home in stable condition -First trimester precautions discussed -Patient advised to follow-up with OB this week as scheduled for prenatal care and BP check -Patient may return to MAU as needed or if her condition were to change or worsen   Amado Nash CNM 03/30/2021, 4:31 PM

## 2021-03-30 NOTE — MAU Note (Signed)
Pt reports to mau with c/o elevated bp since Monday.  Pt denies any hx of hypertension.  Pt reports she started spotting yesterday.  Denies recent intercourse or pain.

## 2021-03-30 NOTE — Discharge Instructions (Signed)

## 2021-05-07 IMAGING — CT CT RENAL STONE PROTOCOL
2 of 4 series · 17 of 46 positions shown, 19 images · non-contrast
Comparison: 03/14/2020

CLINICAL DATA: Flank pain on the right with kidney stone suspected

EXAM:
CT ABDOMEN AND PELVIS WITHOUT CONTRAST
TECHNIQUE: Multidetector CT imaging of the abdomen and pelvis was performed
following the standard protocol without IV contrast.

[Series 2: axial st · axial · 0.95mm/px · z∈[+749,+1249]mm · 14 of 110 slices shown, 16 images]
[im 5/110  soft-tissue]
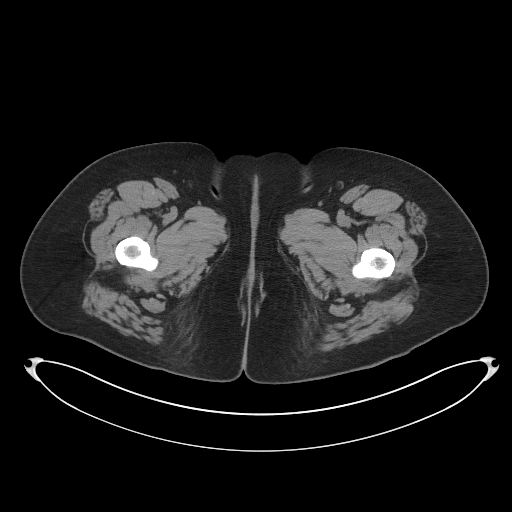
[im 5/110  bone]
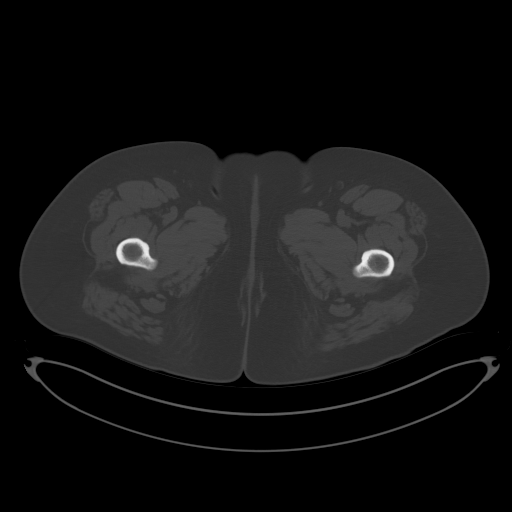
[im 15/110  soft-tissue]
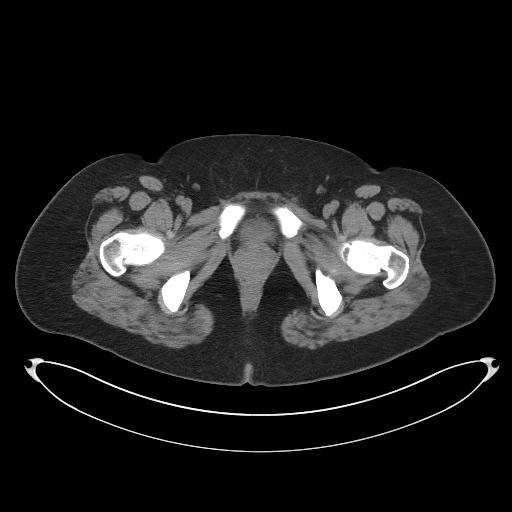
[im 19/110  soft-tissue]
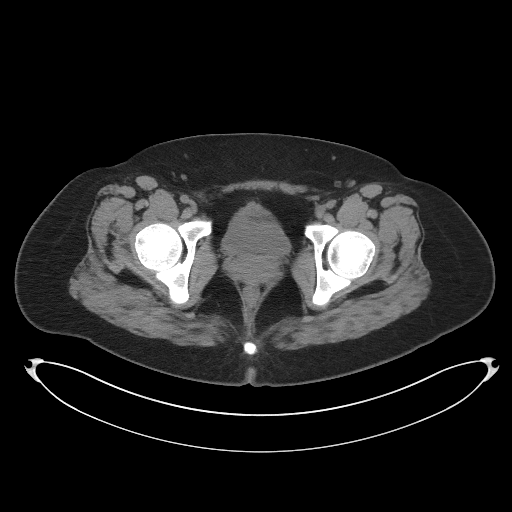
[im 29/110  soft-tissue]
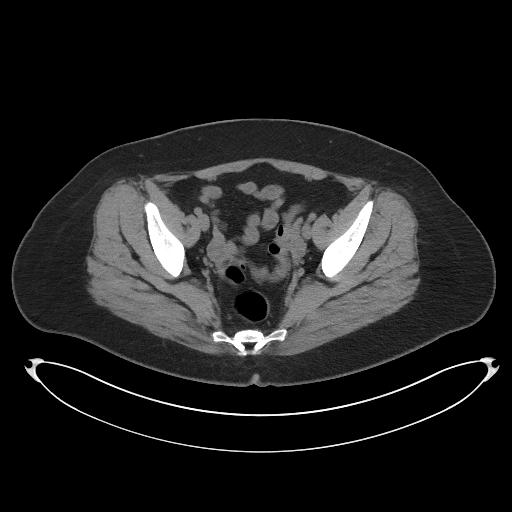
[im 38/110  soft-tissue]
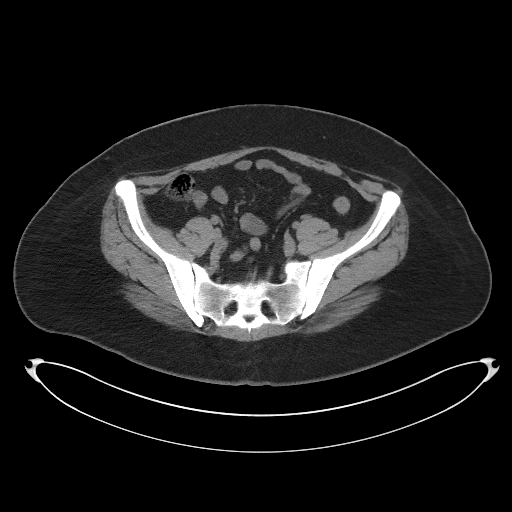
[im 43/110  soft-tissue]
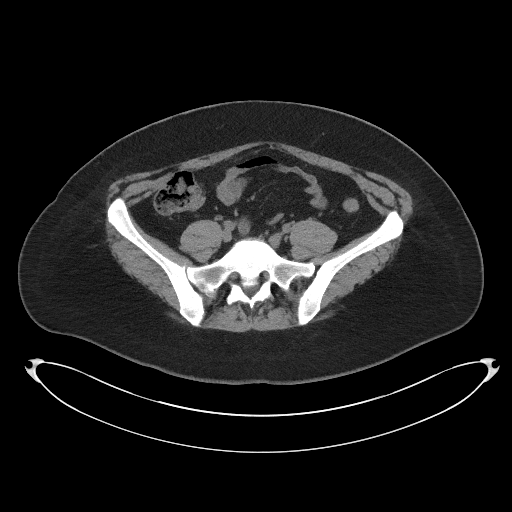
[im 53/110  soft-tissue]
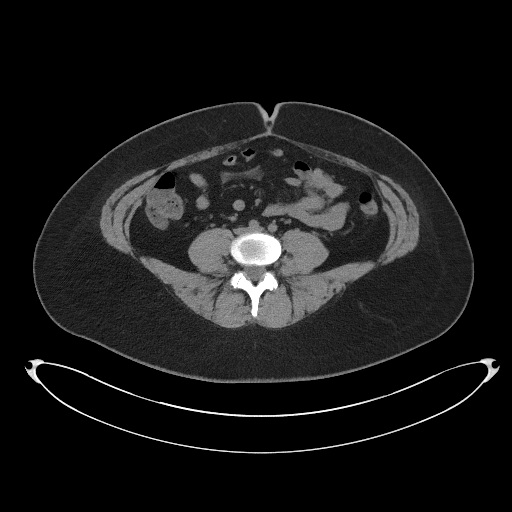
[im 57/110  soft-tissue]
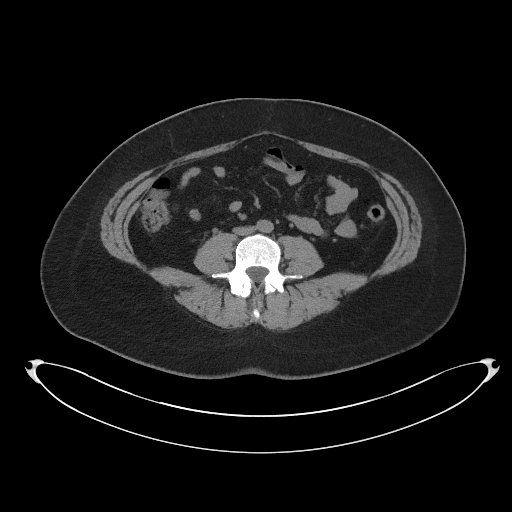
[im 67/110  soft-tissue]
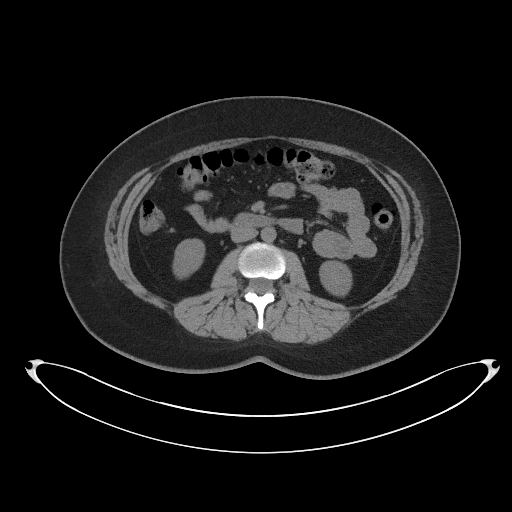
[im 67/110  bone]
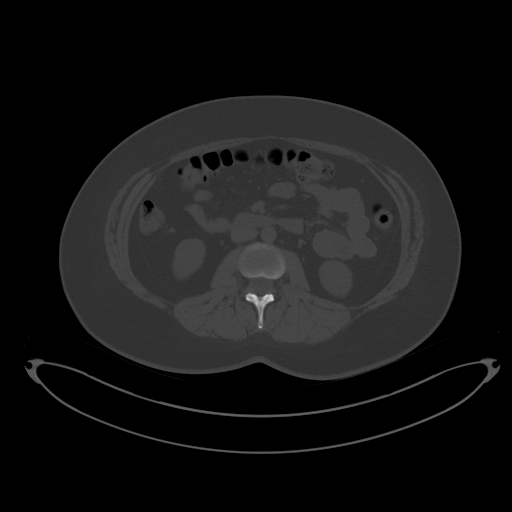
[im 72/110  soft-tissue]
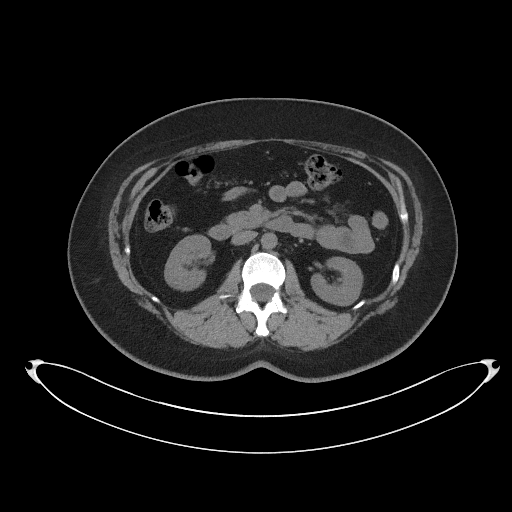
[im 81/110  soft-tissue]
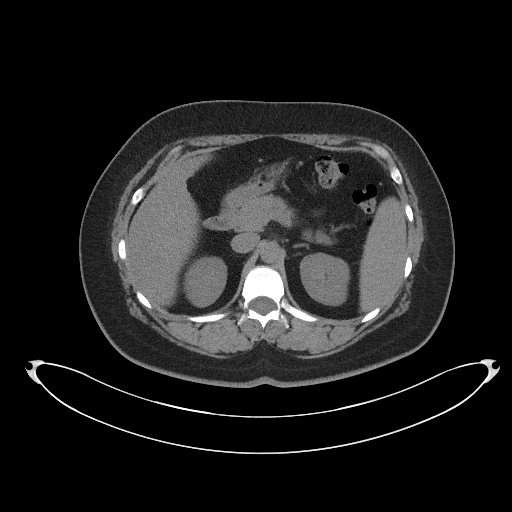
[im 91/110  soft-tissue]
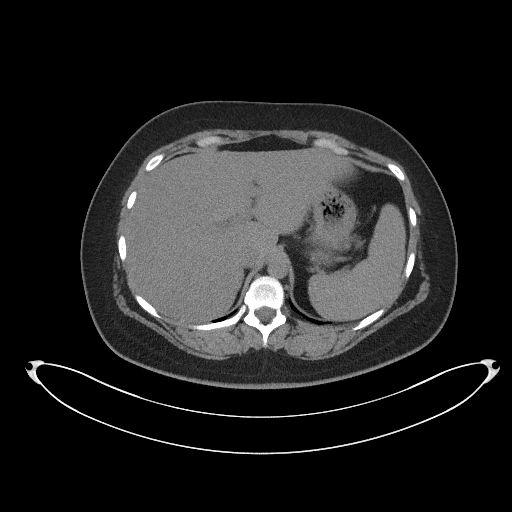
[im 95/110  soft-tissue]
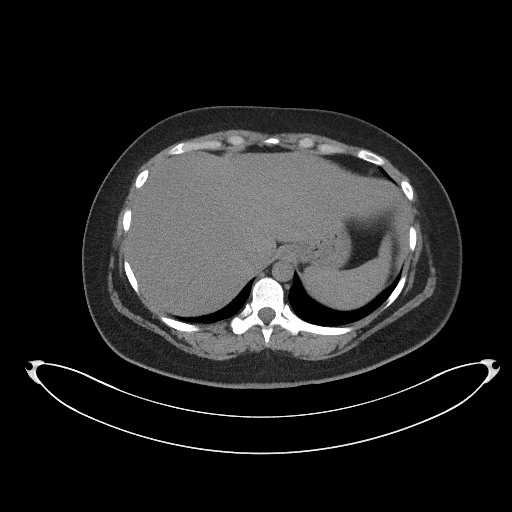
[im 105/110  soft-tissue]
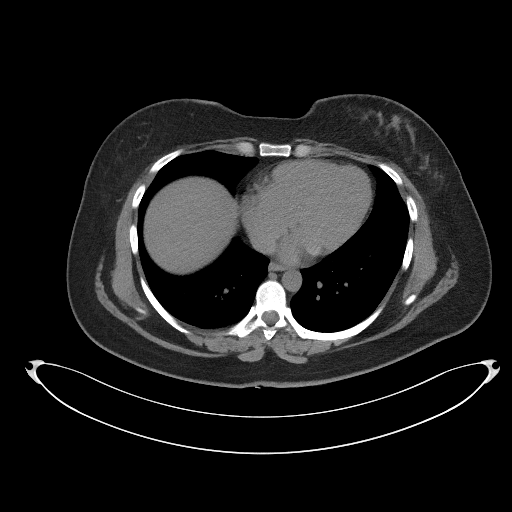

[Series 5: coronal st · coronal · 0.90mm/px · 3 of 92 slices shown]
[im 31/92  soft-tissue]
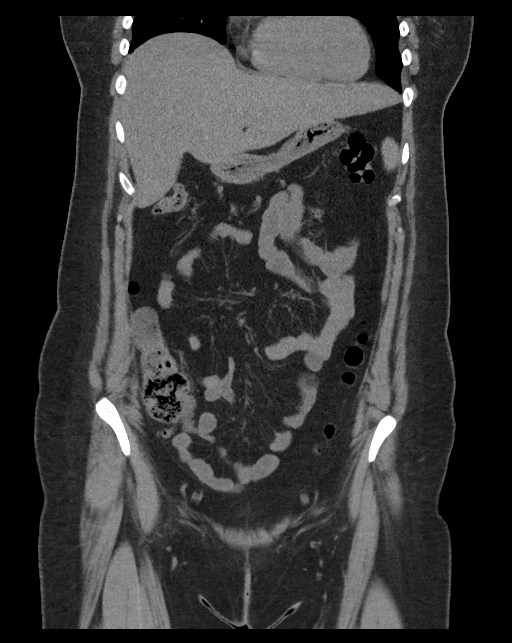
[im 41/92  soft-tissue]
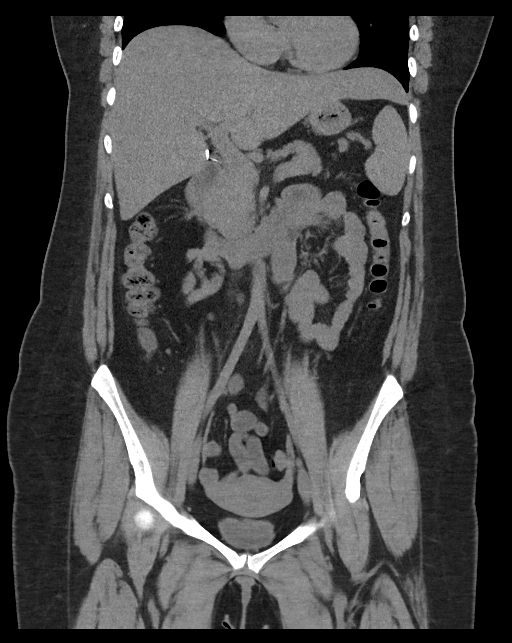
[im 51/92  soft-tissue]
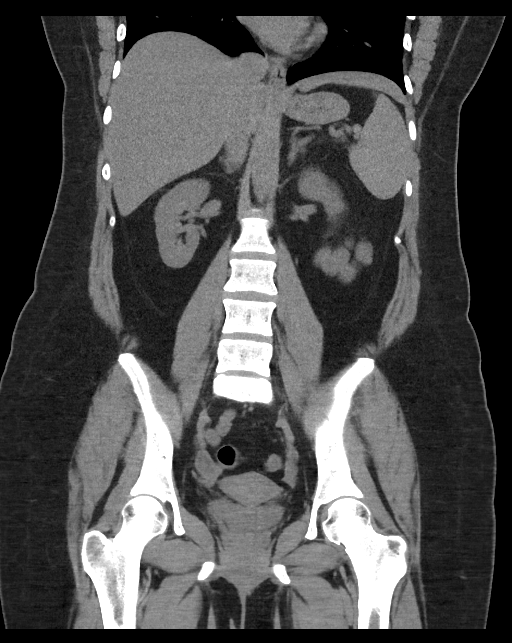

[17 of 46 positions shown; findings below may reference images not displayed]

FINDINGS: Lower chest:  No contributory findings.

Hepatobiliary: Hepatic steatosis.Cholecystectomy without bile duct
dilatation.

Pancreas: Unremarkable.

Spleen: Unremarkable.

Adrenals/Urinary Tract: Negative adrenals. No hydronephrosis or
ureteral calculus. Tiny calculi in the pelvis are stable from prior
and attributed to phleboliths. Punctate right lower pole calculus.
Mild lobulation of the right kidney which appears slightly smaller
than the left, likely scarring. Unremarkable bladder.

Stomach/Bowel:  No obstruction. No appendicitis.

Vascular/Lymphatic: No acute vascular abnormality. No mass or
adenopathy.

Reproductive:No pathologic findings.

Other: No ascites or pneumoperitoneum.

Musculoskeletal: No acute abnormalities.
IMPRESSION: 1. No hydronephrosis or ureteral calculus.
2. Punctate right renal calculus.
3. Hepatic steatosis.

## 2021-07-18 ENCOUNTER — Ambulatory Visit: Payer: 59 | Admitting: Cardiology

## 2021-07-18 ENCOUNTER — Encounter: Payer: Self-pay | Admitting: Cardiology

## 2021-07-18 ENCOUNTER — Inpatient Hospital Stay: Payer: 59

## 2021-07-18 ENCOUNTER — Other Ambulatory Visit: Payer: Self-pay

## 2021-07-18 VITALS — BP 138/85 | HR 138 | Temp 98.7°F | Resp 16 | Ht 69.0 in | Wt 236.0 lb

## 2021-07-18 DIAGNOSIS — R000 Tachycardia, unspecified: Secondary | ICD-10-CM

## 2021-07-18 DIAGNOSIS — M79604 Pain in right leg: Secondary | ICD-10-CM | POA: Insufficient documentation

## 2021-07-18 DIAGNOSIS — R002 Palpitations: Secondary | ICD-10-CM

## 2021-07-18 NOTE — Progress Notes (Signed)
Patient referred by Bryson Corona, NP for tachycardia  Subjective:   Autumn Stephens, female    DOB: June 08, 1991, 30 y.o.   MRN: [id number]   Chief Complaint  Patient presents with   Tachycardia   New Patient (Initial Visit)     HPI  30 y.o. Caucasian female with tachycardia, palpitations  Patient is currently in early third trimester pregnancy with her 4th child. Through all her pregnancies, she has had tachycardia and palpitations symptoms. She has been on atenolol and metoprolol and has tolerated them well. Last two weeks, she felt more than usual palpitations symptoms with a "thump". She denies chest pain, shortness of breath, palpitations, leg edema, orthopnea, PND, TIA/syncope.  Echocardiogram in 2011 was essentially normal. Zio patch showed only rare ectopy.   Since this morning, patient also noted pain in her right calf.     Past Medical History:  Diagnosis Date   Diffuse cystic mastopathy    Dysrhythmia    tachycardia on atenol last pregnancy   Epstein-Barr infection    H/O varicella    Irritable bowel syndrome    Maternal anemia complicating pregnancy, childbirth, or the puerperium 09/28/2012   Migraine, unspecified, without mention of intractable migraine without mention of status migrainosus    NSVD (normal spontaneous vaginal delivery - 11/5) 09/28/2012   Other malaise and fatigue    Perineal laceration, first degree 09/28/2012   Postpartum care following vaginal delivery 09/28/2012   Pregnancy induced hypertension    Pyelonephritis    Rubella nonimmune status, delivered, current hospitalization 09/28/2012   Tachycardia      Past Surgical History:  Procedure Laterality Date   CHOLECYSTECTOMY N/A 04/16/2019   Procedure: LAPAROSCOPIC CHOLECYSTECTOMY WITH INTRAOPERATIVE CHOLANGIOGRAM;  Surgeon: Autumn Messing III, MD;  Location: [facility];  Service: General;  Laterality: N/A;   NO PAST SURGERIES       Social History   Tobacco Use  Smoking Status Never   Smokeless Tobacco Never    Social History   Substance and Sexual Activity  Alcohol Use No     Family History  Problem Relation Age of Onset   Cancer Mother        skin   Hyperlipidemia Mother    Rheum arthritis Father    Diabetes Maternal Grandfather    Cancer Maternal Grandfather        prostate     Current Outpatient Medications on File Prior to Visit  Medication Sig Dispense Refill   metoprolol succinate (TOPROL-XL) 25 MG 24 hr tablet Take 2 tablets (50 mg total) by mouth daily. 60 tablet 0   No current facility-administered medications on file prior to visit.    Cardiovascular and other pertinent studies:  EKG 07/18/2021: Sinus rhythm 96 bpm Normal EKG  Zio patch 04/09/2020: Enrollment 03/19/2020-03/26/2020 (6 days 21 hours). Patient had a min HR of 34 bpm (sinus bradycardia, [facility] 03/20/2020 4:23 AM, no symptoms), max HR of 192 bpm (sinus tachycardia), and avg HR of 84 bpm (normal sinus rhythm). Predominant underlying rhythm was Sinus Rhythm. 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 No Isolated SVEs, SVE Couplets, or SVE Triplets were present. Isolated VEs were rare (<1.0%), and no VE Couplets or VE Triplets were present. No atrial fibrillation. There were 24 patient triggered events that coincidedd with normal sinus rhythm to sinus tachycardia (heart rate range 64-181 bpm). Diary summarized below:    03/19/2020 9:57 PM:  Symptoms of chest pain/pressure coincided with normal sinus rhythm 93 bpm.    Impression:   1. No significant arrhythmias.  2. No atrial fibrillation.  3. Rare ectopy.   Echocardiogram 2011:  Left ventricle: The cavity size was normal. Systolic function was   normal. The estimated ejection fraction was in the range of 55% to   60%. Wall motion was normal; there were no regional wall motion   abnormalities. Left ventricular diastolic function  parameters were   normal.      Recent labs: 11/26/2020: Glucose 93, BUN/Cr 12/0.97. EGFR >60. Na/K 140/4.1. Rest of the CMP normal H/H 14/42. MCV 93. Platelets 225   Review of Systems  Cardiovascular:  Negative for chest pain, dyspnea on exertion, leg swelling, palpitations and syncope.  Musculoskeletal:        Right calf pain        Vitals:   07/18/21 1350  BP: 138/85  Pulse: (!) 138  Resp: 16  Temp: 98.7 F (37.1 C)  SpO2: 98%     Body mass index is 34.85 kg/m. Filed Weights   07/18/21 1350  Weight: 236 lb (107 kg)    Objective:   Physical Exam Vitals and nursing note reviewed.  Constitutional:      General: She is not in acute distress. Neck:     Vascular: No JVD.  Cardiovascular:     Rate and Rhythm: Normal rate and regular rhythm.     Heart sounds: Normal heart sounds. No murmur heard. Pulmonary:     Effort: Pulmonary effort is normal.     Breath sounds: Normal breath sounds. No wheezing or rales.  Musculoskeletal:     Right lower leg: No edema.     Left lower leg: No edema.         Assessment & Recommendations:   30 y.o. Caucasian female with tachycardia, palpitations  Likely benign ectopy/sinus tachycardia, similar to her previous pregnancies. Normal physical exam and EKG. Okay to continue metoprolol succinate 50 mg daily. Given her new right calf pain today, will check LE [place]. If negative, no further workup necessary.    Thank you for referring the patient to [place]. Please feel free to contact with any questions.   Nigel Mormon, MD Pager: [phone] Office: [phone]

## 2021-07-23 ENCOUNTER — Other Ambulatory Visit: Payer: 59

## 2021-08-05 ENCOUNTER — Telehealth: Payer: Self-pay | Admitting: Cardiology

## 2021-08-05 NOTE — Telephone Encounter (Signed)
Pt asked about results from monitor; wants to be sure someone will call with results as soon as they are received & reviewed.

## 2021-08-06 NOTE — Telephone Encounter (Signed)
Pts test results have not been reviewed by the provider yet. Pt aware.

## 2021-08-08 NOTE — Progress Notes (Signed)
Discussed the results with the patient. Overall, reassuring results. No changes needed.  Elder Negus, MD

## 2021-08-14 ENCOUNTER — Telehealth: Payer: Self-pay | Admitting: Cardiology

## 2021-08-18 ENCOUNTER — Telehealth: Payer: Self-pay | Admitting: Cardiology

## 2021-08-18 NOTE — Telephone Encounter (Signed)
Spoke with Pt , she stated she does not want the Echo due that she does not have an leg pain anymore

## 2021-10-03 ENCOUNTER — Inpatient Hospital Stay (HOSPITAL_COMMUNITY)
Admission: AD | Admit: 2021-10-03 | Discharge: 2021-10-03 | Disposition: A | Discharge status: Home / Self Care | Payer: 59 | Attending: Obstetrics | Admitting: Obstetrics

## 2021-10-03 ENCOUNTER — Encounter (HOSPITAL_COMMUNITY): Payer: Self-pay | Admitting: Obstetrics

## 2021-10-03 DIAGNOSIS — Z20822 Contact with and (suspected) exposure to covid-19: Secondary | ICD-10-CM | POA: Insufficient documentation

## 2021-10-03 DIAGNOSIS — R059 Cough, unspecified: Secondary | ICD-10-CM | POA: Insufficient documentation

## 2021-10-03 DIAGNOSIS — R000 Tachycardia, unspecified: Secondary | ICD-10-CM | POA: Diagnosis not present

## 2021-10-03 DIAGNOSIS — J101 Influenza due to other identified influenza virus with other respiratory manifestations: Secondary | ICD-10-CM | POA: Diagnosis not present

## 2021-10-03 DIAGNOSIS — Z8616 Personal history of COVID-19: Secondary | ICD-10-CM | POA: Insufficient documentation

## 2021-10-03 DIAGNOSIS — Z3689 Encounter for other specified antenatal screening: Secondary | ICD-10-CM | POA: Insufficient documentation

## 2021-10-03 DIAGNOSIS — O99513 Diseases of the respiratory system complicating pregnancy, third trimester: Secondary | ICD-10-CM | POA: Diagnosis not present

## 2021-10-03 DIAGNOSIS — Z7901 Long term (current) use of anticoagulants: Secondary | ICD-10-CM | POA: Diagnosis not present

## 2021-10-03 DIAGNOSIS — Z3A37 37 weeks gestation of pregnancy: Secondary | ICD-10-CM

## 2021-10-03 DIAGNOSIS — O26893 Other specified pregnancy related conditions, third trimester: Secondary | ICD-10-CM | POA: Insufficient documentation

## 2021-10-03 LAB — URINALYSIS, ROUTINE W REFLEX MICROSCOPIC
Bilirubin Urine: NEGATIVE
Glucose, UA: NEGATIVE mg/dL
Hgb urine dipstick: NEGATIVE
Ketones, ur: NEGATIVE mg/dL
Nitrite: NEGATIVE
Protein, ur: NEGATIVE mg/dL
Specific Gravity, Urine: 1.015 (ref 1.005–1.030)
pH: 6 (ref 5.0–8.0)

## 2021-10-03 LAB — RESP PANEL BY RT-PCR (FLU A&B, COVID) ARPGX2
Influenza A by PCR: POSITIVE — AB
Influenza B by PCR: NEGATIVE
SARS Coronavirus 2 by RT PCR: NEGATIVE

## 2021-10-03 MED ORDER — BENZONATATE 100 MG PO CAPS: 100.0000 mg | ORAL_CAPSULE | Freq: Three times a day (TID) | ORAL | 0 refills | Status: DC

## 2021-10-03 MED ORDER — GUAIFENESIN ER 600 MG PO TB12: 600.0000 mg | ORAL_TABLET | Freq: Two times a day (BID) | ORAL | 0 refills | Status: AC | PRN

## 2021-10-03 NOTE — Discharge Instructions (Signed)

## 2021-10-03 NOTE — MAU Provider Note (Signed)
History     CSN: [id number]  Arrival date and time: 10/03/21 0744   Event Date/Time   First Provider Initiated Contact with Patient 10/03/21 [phone]      Chief Complaint  Patient presents with   Cough   Shortness of Breath   HPI Autumn Stephens is a 30 y.o. G4P3003 at [redacted]w[redacted]d who presents to MAU with chief complaint of productive cough in the setting of Flu A diagnosis on Tuesday 09/30/2021 . This is a recurrent problem. Cough was first noted on Sunday but at that time was "just a tickle" without any other symptoms. Patient then presented to her OB appointment on Tuesday, "started to feel sick" and was diagnosed with Flu A Tuesday night. She presented to Urgent Care on Wednesday "because the cough started changing". She states they were unable provide new medications and counseled her on the nature of viral illnesses, symptom management and "letting it run its course". Patient has attempted management at home with irregular use of cough medicine and throat lozenges with . She voices concern that the chest congestion and cough she feels will have a prolonged negative impact on her body and her pregnancy. She states "this feels just like when I had COVID".  Patient also c/o SOB. Only feels this symptom when she is ambulating. She denies fever, chest pain, palpitations, weakness, syncope. She is prescribed Metoprolol 50 mg daily for benign tachycardia s/p outpatient Cardiology consult 07/18/21. Most recently taken this morning before her arrival to MAU.  She denies vaginal bleeding, leaking of fluid, decreased fetal movement, fever, falls, or recent illness.   Patient receives care with [facility] and her next appointment is Monday 10/06/21.  OB History     Gravida  4   Para  3   Term  3   Preterm  0   AB  0   Living  3      SAB  0   IAB  0   Ectopic  0   Multiple  0   Live Births  3           Past Medical History:  Diagnosis Date   Diffuse cystic mastopathy     Dysrhythmia    tachycardia on atenol last pregnancy   Epstein-Barr infection    H/O varicella    Irritable bowel syndrome    Maternal anemia complicating pregnancy, childbirth, or the puerperium 09/28/2012   Migraine, unspecified, without mention of intractable migraine without mention of status migrainosus    NSVD (normal spontaneous vaginal delivery - 11/5) 09/28/2012   Other malaise and fatigue    Perineal laceration, first degree 09/28/2012   Postpartum care following vaginal delivery 09/28/2012   Pregnancy induced hypertension    Pyelonephritis    Rubella nonimmune status, delivered, current hospitalization 09/28/2012   Tachycardia     Past Surgical History:  Procedure Laterality Date   CHOLECYSTECTOMY N/A 04/16/2019   Procedure: LAPAROSCOPIC CHOLECYSTECTOMY WITH INTRAOPERATIVE CHOLANGIOGRAM;  Surgeon: Griselda Miner, MD;  Location: [facility];  Service: General;  Laterality: N/A;    Family History  Problem Relation Age of Onset   Cancer Mother        skin   Hyperlipidemia Mother    Rheum arthritis Father    Diabetes Maternal Grandfather    Cancer Maternal Grandfather        prostate    Social History   Tobacco Use   Smoking status: Never   Smokeless tobacco: Never  Vaping Use   Vaping  Use: Never used  Substance Use Topics   Alcohol use: No   Drug use: No    Allergies:  Allergies  Allergen Reactions   Augmentin [Amoxicillin-Pot Clavulanate] Nausea And Vomiting    Did it involve swelling of the face/tongue/throat, SOB, or low BP? No Did it involve sudden or severe rash/hives, skin peeling, or any reaction on the inside of your mouth or nose? No Did you need to seek medical attention at a hospital or doctor's office? No When did it last happen?  "several months ago"     If all above answers are "NO", may proceed with cephalosporin use.    Biaxin [Clarithromycin] Nausea And Vomiting   Moxifloxacin Nausea And Vomiting    Medications Prior to Admission  Medication  Sig Dispense Refill Last Dose   metoprolol succinate (TOPROL-XL) 25 MG 24 hr tablet Take 2 tablets (50 mg total) by mouth daily. 60 tablet 0     Review of Systems  Constitutional:  Positive for fatigue.  Respiratory:  Positive for cough and shortness of breath.        SOB only with prolonged walking  All other systems reviewed and are negative. Physical Exam   Blood pressure 132/81, pulse (!) 106, temperature 97.9 F (36.6 C), temperature source Oral, resp. rate 18, weight 115.7 kg, last menstrual period 12/06/2020, SpO2 98 %.  Physical Exam Vitals and nursing note reviewed.  Constitutional:      Appearance: She is well-developed. She is obese.  Cardiovascular:     Heart sounds: Normal heart sounds.     Comments: Rate consistent with patient baseline per Cardiology and prenatal notes Pulmonary:     Effort: Pulmonary effort is normal.     Breath sounds: Normal breath sounds.     Comments: Patient ambulating independently without difficulty. Speaking in full sentences without signs of increased work of breathing Skin:    Capillary Refill: Capillary refill takes less than 2 seconds.  Neurological:     Mental Status: She is alert and oriented to person, place, and time.  Psychiatric:        Mood and Affect: Mood normal.        Behavior: Behavior normal.    MAU Course/MDM  Procedures  --Pertinent negatives: abnormal lung sounds, fever, activity intolerance, abnormal vital signs --Reactive tracing: baseline 135, mod var, + accels, no decels --Toco: irregular coughing reflected on monitor, otherwise quiet --Patient given option of initiating symptom management medication in MAU while waiting for COVID and Flu swabs. Patient declines and requests discharge home without swab result and without administration of medications in MAU --Patient called at home at 0955. Confirmed + Flu A, negative COVID. Offered Lawyer. Signs of worsening acuity reviewed. Patient denies questions at  end of call.  Orders Placed This Encounter  Procedures   Resp Panel by RT-PCR (Flu A&B, Covid) Nasopharyngeal Swab   Urinalysis, Routine w reflex microscopic Urine, Clean Catch   Droplet precaution   Discharge patient   Patient Vitals for the past 24 hrs:  BP Temp Temp src Pulse Resp SpO2 Weight  10/03/21 0904 130/76 -- -- (!) 104 18 -- --  10/03/21 0831 -- -- -- -- -- 98 % --  10/03/21 0826 -- -- -- -- -- 99 % --  10/03/21 0823 -- -- -- -- -- 99 % --  10/03/21 0821 -- -- -- -- -- 99 % --  10/03/21 0818 132/81 97.9 F (36.6 C) Oral (!) 106 18 -- --  10/03/21 0811 -- -- -- -- -- --  115.7 kg   Results for orders placed or performed during the hospital encounter of 10/03/21 (from the past 24 hour(s))  Resp Panel by RT-PCR (Flu A&B, Covid) Nasopharyngeal Swab     Status: Abnormal   Collection Time: 10/03/21  8:30 AM   Specimen: Nasopharyngeal Swab; Nasopharyngeal(NP) swabs in vial transport medium  Result Value Ref Range   SARS Coronavirus 2 by RT PCR NEGATIVE NEGATIVE   Influenza A by PCR POSITIVE (A) NEGATIVE   Influenza B by PCR NEGATIVE NEGATIVE  Urinalysis, Routine w reflex microscopic Urine, Clean Catch     Status: Abnormal   Collection Time: 10/03/21  8:51 AM  Result Value Ref Range   Color, Urine YELLOW YELLOW   APPearance HAZY (A) CLEAR   Specific Gravity, Urine 1.015 1.005 - 1.030   pH 6.0 5.0 - 8.0   Glucose, UA NEGATIVE NEGATIVE mg/dL   Hgb urine dipstick NEGATIVE NEGATIVE   Bilirubin Urine NEGATIVE NEGATIVE   Ketones, ur NEGATIVE NEGATIVE mg/dL   Protein, ur NEGATIVE NEGATIVE mg/dL   Nitrite NEGATIVE NEGATIVE   Leukocytes,Ua SMALL (A) NEGATIVE   RBC / HPF 0-5 0 - 5 RBC/hpf   WBC, UA 6-10 0 - 5 WBC/hpf   Bacteria, UA FEW (A) NONE SEEN   Squamous Epithelial / LPF 11-20 0 - 5   Mucus PRESENT    Budding Yeast PRESENT    Ca Oxalate Crys, UA PRESENT    Meds ordered this encounter  Medications   guaiFENesin (MUCINEX) 600 MG 12 hr tablet    Sig: Take 1 tablet  (600 mg total) by mouth 2 (two) times daily as needed for up to 14 days.    Dispense:  28 tablet    Refill:  0    Order Specific Question:   Supervising Provider    Answer:   Dilkon Bing [[id number]]   benzonatate (TESSALON) 100 MG capsule    Sig: Take 1 capsule (100 mg total) by mouth every 8 (eight) hours.    Dispense:  21 capsule    Refill:  0    Order Specific Question:   Supervising Provider    Answer:    Bing [[id number]]   Assessment and Plan  --30 y.o. G4P3003 at [redacted]w[redacted]d  --Flu A, no acute symptoms, VSS --Reactive tracing --Mucinex during day only, discontinue in evening to facilitate rest --Discharge home in stable condition  F/U: --Next appointment with [facility] is 10/06/2021  Calvert Cantor, CNM 10/03/2021, 11:25 AM

## 2021-10-03 NOTE — MAU Note (Signed)
Tested + for flu on Tues, kids had it last wk.  Has tested herself for covid x3, all neg.  Pt states cough has worsened, producted, brown mucous.  Becomes SOB with activity.  Has lost sense of taste, feels like when she had covid.

## 2021-10-12 ENCOUNTER — Inpatient Hospital Stay (HOSPITAL_COMMUNITY)
Admission: AD | Admit: 2021-10-12 | Discharge: 2021-10-12 | Disposition: A | Discharge status: Home / Self Care | Payer: 59 | Attending: Obstetrics | Admitting: Obstetrics

## 2021-10-12 ENCOUNTER — Encounter (HOSPITAL_COMMUNITY): Payer: Self-pay | Admitting: Obstetrics

## 2021-10-12 ENCOUNTER — Other Ambulatory Visit: Payer: Self-pay

## 2021-10-12 DIAGNOSIS — O26893 Other specified pregnancy related conditions, third trimester: Secondary | ICD-10-CM | POA: Insufficient documentation

## 2021-10-12 DIAGNOSIS — O10913 Unspecified pre-existing hypertension complicating pregnancy, third trimester: Secondary | ICD-10-CM | POA: Insufficient documentation

## 2021-10-12 DIAGNOSIS — Z3A38 38 weeks gestation of pregnancy: Secondary | ICD-10-CM | POA: Diagnosis not present

## 2021-10-12 DIAGNOSIS — Z3689 Encounter for other specified antenatal screening: Secondary | ICD-10-CM

## 2021-10-12 LAB — URINALYSIS, ROUTINE W REFLEX MICROSCOPIC
Bilirubin Urine: NEGATIVE
Glucose, UA: 50 mg/dL — AB
Hgb urine dipstick: NEGATIVE
Ketones, ur: NEGATIVE mg/dL
Leukocytes,Ua: NEGATIVE
Nitrite: NEGATIVE
Protein, ur: NEGATIVE mg/dL
Specific Gravity, Urine: 1.001 — ABNORMAL LOW (ref 1.005–1.030)
pH: 7 (ref 5.0–8.0)

## 2021-10-12 LAB — COMPREHENSIVE METABOLIC PANEL
ALT: 8 U/L (ref 0–44)
AST: 13 U/L — ABNORMAL LOW (ref 15–41)
Albumin: 2.2 g/dL — ABNORMAL LOW (ref 3.5–5.0)
Alkaline Phosphatase: 67 U/L (ref 38–126)
Anion gap: 7 (ref 5–15)
BUN: 7 mg/dL (ref 6–20)
CO2: 22 mmol/L (ref 22–32)
Calcium: 9.1 mg/dL (ref 8.9–10.3)
Chloride: 109 mmol/L (ref 98–111)
Creatinine, Ser: 0.76 mg/dL (ref 0.44–1.00)
GFR, Estimated: >60 mL/min (ref >60)
Glucose, Bld: 106 mg/dL — ABNORMAL HIGH (ref 70–99)
Potassium: 4.6 mmol/L (ref 3.5–5.1)
Sodium: 138 mmol/L (ref 135–145)
Total Bilirubin: 0.6 mg/dL (ref 0.3–1.2)
Total Protein: 5.5 g/dL — ABNORMAL LOW (ref 6.5–8.1)

## 2021-10-12 LAB — CBC
HCT: 32.7 % — ABNORMAL LOW (ref 36.0–46.0)
Hemoglobin: 10.9 g/dL — ABNORMAL LOW (ref 12.0–15.0)
MCH: 32 pg (ref 26.0–34.0)
MCHC: 33.3 g/dL (ref 30.0–36.0)
MCV: 95.9 fL (ref 80.0–100.0)
Platelets: 181 10*3/uL (ref 150–400)
RBC: 3.41 MIL/uL — ABNORMAL LOW (ref 3.87–5.11)
RDW: 14.6 % (ref 11.5–15.5)
WBC: 7 10*3/uL (ref 4.0–10.5)
nRBC: 0 % (ref 0.0–0.2)

## 2021-10-12 LAB — PROTEIN / CREATININE RATIO, URINE
Creatinine, Urine: 11.43 mg/dL
Total Protein, Urine: <6 mg/dL

## 2021-10-12 NOTE — MAU Note (Signed)
..  Autumn Stephens is a 30 y.o. at [redacted]w[redacted]d here in MAU reporting: elevated blood pressure at home, she took it because she tingling in her face. Reports swelling in her face and legs. Decreased fetal movement since yesterday. Denies regular contractions. Denies headache, visual changes, or epigastric pain.   Pain score: 0/10 Vitals:   10/12/21 0220  BP: (!) 144/87  Pulse: (!) 113  Resp: 17  Temp: 97.7 F (36.5 C)  SpO2: 100%     FHT:135 Lab orders placed from triage: UA

## 2021-10-12 NOTE — MAU Provider Note (Signed)
History     CSN: [id number]  Arrival date and time: 10/12/21 0206   Event Date/Time   First Provider Initiated Contact with Patient 10/12/21 0303      Chief Complaint  Patient presents with   Hypertension   Facial Swelling   Arbie Reisz is a 30 y.o. G4P3003 at [redacted]w[redacted]d who receives care at [facility].  She presents today for Hypertension.  She reports feeling restless and nauseous throughout the day.  She states she then experienced some "tingling on the left side of my face," and decided to take her blood pressure.  She reports her blood pressure was 156/104, then decrease to 140s over 90s.  She denies headache, visual disturbances, and RUQ pain.  She goes on to state that she does have some head pain that is mild when she is coughing.  She also states she feels her hands and face are swollen.  Patient endorses fetal movement and contractions but denies vaginal concerns.  She reports that she was not dilated on Monday or Wednesday despite contractions.    OB History     Gravida  4   Para  3   Term  3   Preterm  0   AB  0   Living  3      SAB  0   IAB  0   Ectopic  0   Multiple  0   Live Births  3           Past Medical History:  Diagnosis Date   Diffuse cystic mastopathy    Dysrhythmia    tachycardia on atenol last pregnancy   Epstein-Barr infection    H/O varicella    Irritable bowel syndrome    Maternal anemia complicating pregnancy, childbirth, or the puerperium 09/28/2012   Migraine, unspecified, without mention of intractable migraine without mention of status migrainosus    NSVD (normal spontaneous vaginal delivery - 11/5) 09/28/2012   Other malaise and fatigue    Perineal laceration, first degree 09/28/2012   Postpartum care following vaginal delivery 09/28/2012   Pregnancy induced hypertension    Pyelonephritis    Rubella nonimmune status, delivered, current hospitalization 09/28/2012   Tachycardia     Past Surgical History:  Procedure  Laterality Date   CHOLECYSTECTOMY N/A 04/16/2019   Procedure: LAPAROSCOPIC CHOLECYSTECTOMY WITH INTRAOPERATIVE CHOLANGIOGRAM;  Surgeon: Griselda Miner, MD;  Location: [facility];  Service: General;  Laterality: N/A;    Family History  Problem Relation Age of Onset   Cancer Mother        skin   Hyperlipidemia Mother    Rheum arthritis Father    Diabetes Maternal Grandfather    Cancer Maternal Grandfather        prostate    Social History   Tobacco Use   Smoking status: Never   Smokeless tobacco: Never  Vaping Use   Vaping Use: Never used  Substance Use Topics   Alcohol use: No   Drug use: No    Allergies:  Allergies  Allergen Reactions   Augmentin [Amoxicillin-Pot Clavulanate] Nausea And Vomiting    Did it involve swelling of the face/tongue/throat, SOB, or low BP? No Did it involve sudden or severe rash/hives, skin peeling, or any reaction on the inside of your mouth or nose? No Did you need to seek medical attention at a hospital or doctor's office? No When did it last happen?  "several months ago"     If all above answers are "NO", may proceed  with cephalosporin use.    Biaxin [Clarithromycin] Nausea And Vomiting   Moxifloxacin Nausea And Vomiting    Medications Prior to Admission  Medication Sig Dispense Refill Last Dose   benzonatate (TESSALON) 100 MG capsule Take 1 capsule (100 mg total) by mouth every 8 (eight) hours. 21 capsule 0    guaiFENesin (MUCINEX) 600 MG 12 hr tablet Take 1 tablet (600 mg total) by mouth 2 (two) times daily as needed for up to 14 days. 28 tablet 0    metoprolol succinate (TOPROL-XL) 25 MG 24 hr tablet Take 2 tablets (50 mg total) by mouth daily. 60 tablet 0     Review of Systems  Constitutional:  Negative for chills and fever.  Eyes:  Negative for visual disturbance.  Respiratory:  Positive for cough. Negative for shortness of breath.   Gastrointestinal:  Negative for abdominal pain.  Genitourinary:  Negative for vaginal bleeding and  vaginal discharge.  Musculoskeletal:  Negative for back pain.  Neurological:  Positive for headaches (Head pain with cough). Negative for dizziness and light-headedness.  Physical Exam   Blood pressure (!) 149/100, pulse (!) 105, temperature 97.7 F (36.5 C), temperature source Oral, resp. rate 17, height 5\' 9"  (1.753 m), weight 120.2 kg, last menstrual period 12/06/2020, SpO2 97 %. Vitals:   10/12/21 0220 10/12/21 0236 10/12/21 0245  BP: (!) 144/87 (!) 148/97 (!) 149/100  Pulse: (!) 113 92 (!) 105  Resp: 17    Temp: 97.7 F (36.5 C)    TempSrc: Oral    SpO2: 100% 97%   Weight: 120.2 kg    Height: 5\' 9"  (1.753 m)    ]  Physical Exam Vitals reviewed.  Constitutional:      General: She is not in acute distress.    Appearance: Normal appearance. She is obese.  HENT:     Head: Normocephalic and atraumatic.  Eyes:     Conjunctiva/sclera: Conjunctivae normal.  Cardiovascular:     Rate and Rhythm: Normal rate and regular rhythm.     Heart sounds: Normal heart sounds.  Pulmonary:     Effort: Pulmonary effort is normal. No respiratory distress.     Breath sounds: Normal breath sounds. No wheezing or rales.  Abdominal:     General: Bowel sounds are normal.     Tenderness: There is no abdominal tenderness.     Comments: Gravid, Appears AGA  Musculoskeletal:        General: Normal range of motion.     Cervical back: Normal range of motion.  Skin:    General: Skin is warm and dry.  Neurological:     Mental Status: She is alert and oriented to person, place, and time.  Psychiatric:        Mood and Affect: Mood normal.        Behavior: Behavior normal.        Thought Content: Thought content normal.    Fetal Assessment 135 bpm, Mod Var, -Decels, +15x15 Accels Toco: No ctx graphed  MAU Course   Results for orders placed or performed during the hospital encounter of 10/12/21 (from the past 24 hour(s))  Urinalysis, Routine w reflex microscopic Urine, Clean Catch     Status:  Abnormal   Collection Time: 10/12/21  2:27 AM  Result Value Ref Range   Color, Urine COLORLESS (A) YELLOW   APPearance CLEAR CLEAR   Specific Gravity, Urine 1.001 (L) 1.005 - 1.030   pH 7.0 5.0 - 8.0   Glucose, UA 50 (A)  NEGATIVE mg/dL   Hgb urine dipstick NEGATIVE NEGATIVE   Bilirubin Urine NEGATIVE NEGATIVE   Ketones, ur NEGATIVE NEGATIVE mg/dL   Protein, ur NEGATIVE NEGATIVE mg/dL   Nitrite NEGATIVE NEGATIVE   Leukocytes,Ua NEGATIVE NEGATIVE   No results found.  MDM Physical Exam Labs: CBC, CMP, PC Ratio Measure BPQ15 min EFM Pain Management Assessment and Plan  30 year old G4P3003  SIUP at 38.3 weeks Cat I FT CHTN  -POC Reviewed -Discussed collecting labs to r/o Superimposed PreE -Labs ordered -NST Reactive. -Will await results  Maryann Conners MSN, CNM 10/12/2021, 3:03 AM   Reassessment (4:08 AM) Vitals:   10/12/21 0315 10/12/21 0330 10/12/21 0345 10/12/21 0410  BP: (!) 141/89 127/84 127/85 138/78  Pulse: 97 90 86 99  Resp:      Temp:      TempSrc:      SpO2:      Weight:      Height:        -Nurse reports CBC and PC Ratio returns without significant findings.  However reports CMP was hemolyzed and required redraw. -Results Reviewed. -BP now normotensive. -Patient continues to be without complaints/symptoms. -Given option to wait for CMP to return or discharge to home with understanding that if labs return abnormal she would have to come back to hospital. -Patient verbalizes understanding. -Reviewed blood pressure parameters (>/=160/105) and when to call office or report to MAU for evaluation.  -Instructed to keep next appt as scheduled which patient reports is Monday. -Exam performed.  -Encouraged to call primary office or return to MAU if symptoms worsen or with the onset of new symptoms. -Discharged to home in stable condition.  Maryann Conners MSN, CNM Advanced Practice Provider, [facility]

## 2021-10-15 ENCOUNTER — Other Ambulatory Visit: Payer: Self-pay | Admitting: Obstetrics and Gynecology

## 2021-10-19 ENCOUNTER — Inpatient Hospital Stay (HOSPITAL_COMMUNITY): Payer: 59

## 2021-10-19 ENCOUNTER — Other Ambulatory Visit: Payer: Self-pay

## 2021-10-19 ENCOUNTER — Inpatient Hospital Stay (HOSPITAL_COMMUNITY): Payer: 59 | Admitting: Anesthesiology

## 2021-10-19 ENCOUNTER — Encounter (HOSPITAL_COMMUNITY): Payer: Self-pay | Admitting: Obstetrics and Gynecology

## 2021-10-19 ENCOUNTER — Inpatient Hospital Stay (HOSPITAL_COMMUNITY)
Admission: AD | Admit: 2021-10-19 | Discharge: 2021-10-20 | DRG: 806 | Disposition: A | Discharge status: Home / Self Care | Payer: 59 | Attending: Obstetrics and Gynecology | Admitting: Obstetrics and Gynecology

## 2021-10-19 DIAGNOSIS — R000 Tachycardia, unspecified: Secondary | ICD-10-CM | POA: Diagnosis present

## 2021-10-19 DIAGNOSIS — O99892 Other specified diseases and conditions complicating childbirth: Secondary | ICD-10-CM | POA: Diagnosis present

## 2021-10-19 DIAGNOSIS — D62 Acute posthemorrhagic anemia: Secondary | ICD-10-CM | POA: Diagnosis not present

## 2021-10-19 DIAGNOSIS — O1002 Pre-existing essential hypertension complicating childbirth: Secondary | ICD-10-CM | POA: Diagnosis present

## 2021-10-19 DIAGNOSIS — Z87440 Personal history of urinary (tract) infections: Secondary | ICD-10-CM

## 2021-10-19 DIAGNOSIS — O139 Gestational [pregnancy-induced] hypertension without significant proteinuria, unspecified trimester: Secondary | ICD-10-CM

## 2021-10-19 DIAGNOSIS — Z3A39 39 weeks gestation of pregnancy: Secondary | ICD-10-CM

## 2021-10-19 DIAGNOSIS — Z20822 Contact with and (suspected) exposure to covid-19: Secondary | ICD-10-CM | POA: Diagnosis present

## 2021-10-19 DIAGNOSIS — O9081 Anemia of the puerperium: Secondary | ICD-10-CM | POA: Diagnosis not present

## 2021-10-19 DIAGNOSIS — Z3490 Encounter for supervision of normal pregnancy, unspecified, unspecified trimester: Secondary | ICD-10-CM | POA: Diagnosis present

## 2021-10-19 LAB — CBC
HCT: 33.1 % — ABNORMAL LOW (ref 36.0–46.0)
HCT: 33.9 % — ABNORMAL LOW (ref 36.0–46.0)
Hemoglobin: 10.9 g/dL — ABNORMAL LOW (ref 12.0–15.0)
Hemoglobin: 10.9 g/dL — ABNORMAL LOW (ref 12.0–15.0)
MCH: 32.2 pg (ref 26.0–34.0)
MCH: 31.6 pg (ref 26.0–34.0)
MCHC: 32.9 g/dL (ref 30.0–36.0)
MCHC: 32.2 g/dL (ref 30.0–36.0)
MCV: 97.9 fL (ref 80.0–100.0)
MCV: 98.3 fL (ref 80.0–100.0)
Platelets: 172 10*3/uL (ref 150–400)
Platelets: 168 10*3/uL (ref 150–400)
RBC: 3.38 MIL/uL — ABNORMAL LOW (ref 3.87–5.11)
RBC: 3.45 MIL/uL — ABNORMAL LOW (ref 3.87–5.11)
RDW: 14.9 % (ref 11.5–15.5)
RDW: 14.9 % (ref 11.5–15.5)
WBC: 7.7 10*3/uL (ref 4.0–10.5)
WBC: 9.6 10*3/uL (ref 4.0–10.5)
nRBC: 0 % (ref 0.0–0.2)
nRBC: 0 % (ref 0.0–0.2)

## 2021-10-19 LAB — RPR: RPR Ser Ql: NONREACTIVE

## 2021-10-19 LAB — COMPREHENSIVE METABOLIC PANEL
ALT: 8 U/L (ref 0–44)
AST: 18 U/L (ref 15–41)
Albumin: 2.5 g/dL — ABNORMAL LOW (ref 3.5–5.0)
Alkaline Phosphatase: 77 U/L (ref 38–126)
Anion gap: 9 (ref 5–15)
BUN: 6 mg/dL (ref 6–20)
CO2: 20 mmol/L — ABNORMAL LOW (ref 22–32)
Calcium: 8.7 mg/dL — ABNORMAL LOW (ref 8.9–10.3)
Chloride: 107 mmol/L (ref 98–111)
Creatinine, Ser: 0.72 mg/dL (ref 0.44–1.00)
GFR, Estimated: >60 mL/min (ref >60)
Glucose, Bld: 93 mg/dL (ref 70–99)
Potassium: 4.2 mmol/L (ref 3.5–5.1)
Sodium: 136 mmol/L (ref 135–145)
Total Bilirubin: 0.7 mg/dL (ref 0.3–1.2)
Total Protein: 5.9 g/dL — ABNORMAL LOW (ref 6.5–8.1)

## 2021-10-19 LAB — RESP PANEL BY RT-PCR (FLU A&B, COVID) ARPGX2
Influenza A by PCR: NEGATIVE
Influenza B by PCR: NEGATIVE
SARS Coronavirus 2 by RT PCR: NEGATIVE

## 2021-10-19 LAB — TYPE AND SCREEN
ABO/RH(D): A POS
Antibody Screen: NEGATIVE

## 2021-10-19 MED ORDER — METHYLERGONOVINE MALEATE 0.2 MG PO TABS: 0.2000 mg | ORAL_TABLET | ORAL | Status: DC | PRN

## 2021-10-19 MED ORDER — PHENYLEPHRINE 40 MCG/ML (10ML) SYRINGE FOR IV PUSH (FOR BLOOD PRESSURE SUPPORT): 80.0000 ug | PREFILLED_SYRINGE | INTRAVENOUS | Status: DC | PRN

## 2021-10-19 MED ORDER — SENNOSIDES-DOCUSATE SODIUM 8.6-50 MG PO TABS
2.0000 | ORAL_TABLET | Freq: Every day | ORAL | Status: DC
  Administered 2021-10-20: 09:00:00 2 via ORAL
  Filled 2021-10-19: qty 2

## 2021-10-19 MED ORDER — DIPHENHYDRAMINE HCL 25 MG PO CAPS: 25.0000 mg | ORAL_CAPSULE | Freq: Four times a day (QID) | ORAL | Status: DC | PRN

## 2021-10-19 MED ORDER — ACETAMINOPHEN 325 MG PO TABS: 650.0000 mg | ORAL_TABLET | ORAL | Status: DC | PRN

## 2021-10-19 MED ORDER — OXYCODONE-ACETAMINOPHEN 5-325 MG PO TABS: 2.0000 | ORAL_TABLET | ORAL | Status: DC | PRN

## 2021-10-19 MED ORDER — WITCH HAZEL-GLYCERIN EX PADS: 1.0000 "application " | MEDICATED_PAD | CUTANEOUS | Status: DC | PRN

## 2021-10-19 MED ORDER — SIMETHICONE 80 MG PO CHEW: 80.0000 mg | CHEWABLE_TABLET | ORAL | Status: DC | PRN

## 2021-10-19 MED ORDER — TETANUS-DIPHTH-ACELL PERTUSSIS 5-2.5-18.5 LF-MCG/0.5 IM SUSY: 0.5000 mL | PREFILLED_SYRINGE | Freq: Once | INTRAMUSCULAR | Status: DC

## 2021-10-19 MED ORDER — OXYTOCIN-SODIUM CHLORIDE 30-0.9 UT/500ML-% IV SOLN: 2.5000 [IU]/h | INTRAVENOUS | Status: DC

## 2021-10-19 MED ORDER — IBUPROFEN 600 MG PO TABS
600.0000 mg | ORAL_TABLET | Freq: Four times a day (QID) | ORAL | Status: DC
  Administered 2021-10-19 – 2021-10-20 (×4): 600 mg via ORAL
  Filled 2021-10-19 (×4): qty 1

## 2021-10-19 MED ORDER — METHYLERGONOVINE MALEATE 0.2 MG/ML IJ SOLN: 0.2000 mg | INTRAMUSCULAR | Status: DC | PRN

## 2021-10-19 MED ORDER — LIDOCAINE HCL (PF) 1 % IJ SOLN
INTRAMUSCULAR | Status: DC | PRN
  Administered 2021-10-19 (×2): 4 mL via EPIDURAL

## 2021-10-19 MED ORDER — LIDOCAINE HCL (PF) 1 % IJ SOLN: 30.0000 mL | INTRAMUSCULAR | Status: DC | PRN

## 2021-10-19 MED ORDER — EPHEDRINE 5 MG/ML INJ: 10.0000 mg | INTRAVENOUS | Status: DC | PRN

## 2021-10-19 MED ORDER — CEPHALEXIN 500 MG PO CAPS
500.0000 mg | ORAL_CAPSULE | Freq: Three times a day (TID) | ORAL | Status: DC
  Administered 2021-10-19 – 2021-10-20 (×3): 500 mg via ORAL
  Filled 2021-10-19 (×3): qty 1

## 2021-10-19 MED ORDER — OXYTOCIN BOLUS FROM INFUSION
333.0000 mL | Freq: Once | INTRAVENOUS | Status: AC
  Administered 2021-10-19: 16:00:00 333 mL via INTRAVENOUS

## 2021-10-19 MED ORDER — ONDANSETRON HCL 4 MG/2ML IJ SOLN: 4.0000 mg | INTRAMUSCULAR | Status: DC | PRN

## 2021-10-19 MED ORDER — ONDANSETRON HCL 4 MG/2ML IJ SOLN: 4.0000 mg | Freq: Four times a day (QID) | INTRAMUSCULAR | Status: DC | PRN

## 2021-10-19 MED ORDER — COCONUT OIL OIL: 1.0000 "application " | TOPICAL_OIL | Status: DC | PRN

## 2021-10-19 MED ORDER — OXYCODONE-ACETAMINOPHEN 5-325 MG PO TABS: 1.0000 | ORAL_TABLET | ORAL | Status: DC | PRN

## 2021-10-19 MED ORDER — LACTATED RINGERS IV SOLN: 500.0000 mL | INTRAVENOUS | Status: DC | PRN

## 2021-10-19 MED ORDER — TERBUTALINE SULFATE 1 MG/ML IJ SOLN: 0.2500 mg | Freq: Once | INTRAMUSCULAR | Status: DC | PRN

## 2021-10-19 MED ORDER — MISOPROSTOL 25 MCG QUARTER TABLET
25.0000 ug | ORAL_TABLET | Freq: Once | ORAL | Status: AC
  Administered 2021-10-19: 08:00:00 25 ug via VAGINAL
  Filled 2021-10-19: qty 1

## 2021-10-19 MED ORDER — ZOLPIDEM TARTRATE 5 MG PO TABS: 5.0000 mg | ORAL_TABLET | Freq: Every evening | ORAL | Status: DC | PRN

## 2021-10-19 MED ORDER — DIBUCAINE (PERIANAL) 1 % EX OINT: 1.0000 "application " | TOPICAL_OINTMENT | CUTANEOUS | Status: DC | PRN

## 2021-10-19 MED ORDER — FENTANYL-BUPIVACAINE-NACL 0.5-0.125-0.9 MG/250ML-% EP SOLN
12.0000 mL/h | EPIDURAL | Status: DC | PRN
  Administered 2021-10-19: 14:00:00 12 mL/h via EPIDURAL
  Filled 2021-10-19: qty 250

## 2021-10-19 MED ORDER — BENZOCAINE-MENTHOL 20-0.5 % EX AERO
1.0000 "application " | INHALATION_SPRAY | CUTANEOUS | Status: DC | PRN
  Administered 2021-10-20: 1 via TOPICAL
  Filled 2021-10-19: qty 56

## 2021-10-19 MED ORDER — LACTATED RINGERS IV SOLN: 500.0000 mL | Freq: Once | INTRAVENOUS | Status: DC

## 2021-10-19 MED ORDER — ACETAMINOPHEN 325 MG PO TABS
650.0000 mg | ORAL_TABLET | ORAL | Status: DC | PRN
  Administered 2021-10-19 – 2021-10-20 (×2): 650 mg via ORAL
  Filled 2021-10-19 (×2): qty 2

## 2021-10-19 MED ORDER — SOD CITRATE-CITRIC ACID 500-334 MG/5ML PO SOLN: 30.0000 mL | ORAL | Status: DC | PRN

## 2021-10-19 MED ORDER — PRENATAL MULTIVITAMIN CH
1.0000 | ORAL_TABLET | Freq: Every day | ORAL | Status: DC
  Administered 2021-10-20: 09:00:00 1 via ORAL
  Filled 2021-10-19: qty 1

## 2021-10-19 MED ORDER — ONDANSETRON HCL 4 MG PO TABS: 4.0000 mg | ORAL_TABLET | ORAL | Status: DC | PRN

## 2021-10-19 MED ORDER — PHENYLEPHRINE 40 MCG/ML (10ML) SYRINGE FOR IV PUSH (FOR BLOOD PRESSURE SUPPORT)
80.0000 ug | PREFILLED_SYRINGE | INTRAVENOUS | Status: DC | PRN
  Filled 2021-10-19: qty 10

## 2021-10-19 MED ORDER — LACTATED RINGERS IV SOLN: INTRAVENOUS | Status: DC

## 2021-10-19 MED ORDER — DIPHENHYDRAMINE HCL 50 MG/ML IJ SOLN: 12.5000 mg | INTRAMUSCULAR | Status: DC | PRN

## 2021-10-19 MED ORDER — OXYTOCIN-SODIUM CHLORIDE 30-0.9 UT/500ML-% IV SOLN
1.0000 m[IU]/min | INTRAVENOUS | Status: DC
  Administered 2021-10-19: 13:00:00 2 m[IU]/min via INTRAVENOUS
  Filled 2021-10-19: qty 500

## 2021-10-19 NOTE — Anesthesia Preprocedure Evaluation (Signed)
Anesthesia Evaluation  Patient identified by MRN, date of birth, ID band Patient awake    Reviewed: Allergy & Precautions, Patient's Chart, lab work & pertinent test results  History of Anesthesia Complications Negative for: history of anesthetic complications  Airway Mallampati: II  TM Distance: >3 FB Neck ROM: Full    Dental no notable dental hx.    Pulmonary neg pulmonary ROS,    Pulmonary exam normal        Cardiovascular hypertension, Normal cardiovascular exam     Neuro/Psych  Headaches, negative psych ROS   GI/Hepatic negative GI ROS, Neg liver ROS,   Endo/Other  negative endocrine ROS  Renal/GU negative Renal ROS  negative genitourinary   Musculoskeletal negative musculoskeletal ROS (+)   Abdominal   Peds  Hematology Hgb 10.9, plt 172k   Anesthesia Other Findings Day of surgery medications reviewed with patient.  Reproductive/Obstetrics (+) Pregnancy                             Anesthesia Physical Anesthesia Plan  ASA: 2  Anesthesia Plan: Epidural   Post-op Pain Management:    Induction:   PONV Risk Score and Plan: Treatment may vary due to age or medical condition  Airway Management Planned: Natural Airway  Additional Equipment: Fetal Monitoring  Intra-op Plan:   Post-operative Plan:   Informed Consent: I have reviewed the patients History and Physical, chart, labs and discussed the procedure including the risks, benefits and alternatives for the proposed anesthesia with the patient or authorized representative who has indicated his/her understanding and acceptance.       Plan Discussed with:   Anesthesia Plan Comments:         Anesthesia Quick Evaluation

## 2021-10-19 NOTE — Progress Notes (Addendum)
Autumn Stephens is a 30 y.o. G4P3003 at [redacted]w[redacted]d by LMP admitted for induction of labor due to Hypertension.  Subjective: crampy  Objective: BP (!) 141/88   Pulse 94   Temp 98 F (36.7 C) (Oral)   Resp 18   Ht 5\' 9"  (1.753 m)   Wt 119.7 kg   LMP 12/06/2020   SpO2 100%   BMI 38.99 kg/m  No intake/output data recorded. No intake/output data recorded.  FHT:  FHR: 145 bpm, variability: moderate,  accelerations:  Present,  decelerations:  Absent UC:   irregular, every 5 minutes SVE:   2-3/60/-2 AROM clear  Labs: Lab Results  Component Value Date   WBC 7.7 10/19/2021   HGB 10.9 (L) 10/19/2021   HCT 33.1 (L) 10/19/2021   MCV 97.9 10/19/2021   PLT 172 10/19/2021   CMP     Component Value Date/Time   NA 136 10/19/2021 0736   K 4.2 10/19/2021 0736   CL 107 10/19/2021 0736   CO2 20 (L) 10/19/2021 0736   GLUCOSE 93 10/19/2021 0736   BUN 6 10/19/2021 0736   CREATININE 0.72 10/19/2021 0736   CALCIUM 8.7 (L) 10/19/2021 0736   PROT 5.9 (L) 10/19/2021 0736   ALBUMIN 2.5 (L) 10/19/2021 0736   AST 18 10/19/2021 0736   ALT 8 10/19/2021 0736   ALKPHOS 77 10/19/2021 0736   BILITOT 0.7 10/19/2021 0736   GFRNONAA >60 10/19/2021 0736   GFRAA >60 03/14/2020 1521    Assessment / Plan: Induction of labor due to gestational hypertension,  progressing well on pitocin  Labor: Progressing normally Preeclampsia:  no signs or symptoms of toxicity, intake and ouput balanced, and labs stable Fetal Wellbeing:  Category I Pain Control:  Labor support without medications I/D:  n/a Anticipated MOD:  NSVD  Khyre Germond J 10/19/2021, 2:59 PM

## 2021-10-19 NOTE — Anesthesia Procedure Notes (Signed)
Epidural Patient location during procedure: OB Start time: 10/19/2021 1:31 PM End time: 10/19/2021 1:36 PM  Staffing Anesthesiologist: Kaylyn Layer, MD Performed: anesthesiologist   Preanesthetic Checklist Completed: patient identified, IV checked, risks and benefits discussed, monitors and equipment checked, pre-op evaluation and timeout performed  Epidural Patient position: sitting Prep: DuraPrep and site prepped and draped Patient monitoring: continuous pulse ox, blood pressure and heart rate Approach: midline Location: L3-L4 Injection technique: LOR air  Needle:  Needle type: Tuohy  Needle gauge: 17 G Needle length: 9 cm Catheter type: closed end flexible Catheter size: 19 Gauge Catheter at skin depth: 12 cm Test dose: negative and Other (1% lidocaine)  Assessment Events: blood not aspirated, injection not painful, no injection resistance, no paresthesia and negative IV test  Additional Notes Patient identified. Risks, benefits, and alternatives discussed with patient including but not limited to bleeding, infection, nerve damage, paralysis, failed block, incomplete pain control, headache, blood pressure changes, nausea, vomiting, reactions to medication, itching, and postpartum back pain. Confirmed with bedside nurse the patient's most recent platelet count. Confirmed with patient that they are not currently taking any anticoagulation, have any bleeding history, or any family history of bleeding disorders. Patient expressed understanding and wished to proceed. All questions were answered. Sterile technique was used throughout the entire procedure. Please see nursing notes for vital signs.   Crisp LOR after 2 needle redirections. Test dose was given through epidural catheter and negative prior to continuing to dose epidural or start infusion. Warning signs of high block given to the patient including shortness of breath, tingling/numbness in hands, complete motor block, or  any concerning symptoms with instructions to call for help. Patient was given instructions on fall risk and not to get out of bed. All questions and concerns addressed with instructions to call with any issues or inadequate analgesia.  Reason for block:procedure for pain

## 2021-10-19 NOTE — Lactation Note (Signed)
This note was copied from a baby's chart. Lactation Consultation Note  Patient Name: Autumn Stephens QQPYP'P Date: 10/19/2021 Reason for consult: L&D Initial assessment;Term Age:31 hours  L&D consult with <60 minutes old infant and P4 mother. Congratulated family on newborn. 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 First child was never diagnosed with TT while she attempted breastfeeding, she exclusively pumped. .   Assisted with latch, laid back position, several attempts to latch unsuccessful. Baby is tongue thrusting. Assisted with hand expression, unable to see colostrum and mother verbalizes discomfort. Newborn placed back to skin to skin.   Discussed STS as ideal transition for infants after birth. Talked about primal reflexes. Explained LC services availability during postpartum stay. Thanked family for their time.      Maternal Data Has patient been taught Hand Expression?: Yes Does the patient have breastfeeding experience prior to this delivery?: Yes How long did the patient breastfeed?: 9-12 months x3 ( first child exclusively pumping); tongue tie x3  Feeding Mother's Current Feeding Choice: Breast Milk  LATCH Score Latch: Too sleepy or reluctant, no latch achieved, no sucking elicited.  Audible Swallowing: None  Type of Nipple: Everted at rest and after stimulation (short shafted)  Comfort (Breast/Nipple): Soft / non-tender  Hold (Positioning): Assistance needed to correctly position infant at breast and maintain latch.  LATCH Score: 5  Interventions Interventions: Assisted with latch;Skin to skin;Breast massage;Hand express  Discharge Pump: Personal  Consult Status Consult Status: Follow-up from L&D Date: 10/19/21 Follow-up type: In-patient    Autumn Stephens [name] 10/19/2021, 4:17 PM

## 2021-10-19 NOTE — Lactation Note (Signed)
This note was copied from a baby's chart. Lactation Consultation Note  Patient Name: Autumn Stephens Date: 10/19/2021 Reason for consult: Initial assessment;Mother's request;Term Age:31 hours  Mom experienced with breastfeeding, first child for one year. Mom complaining of latch pinching. LC assisted with cross cradle latch with infant prone. Mom states pinching resolved.   Mom feeding plan for now EBM at breast. Mom states start pumping after 4 weeks. Mom had hx of engorgement in the past when initiated pumping during her hospital stay.   Plan 1. To feed based on cues 8-12x 24hr period. Mom to offer breasts and look for signs of milk transfer.  2. If unable to latch, Mom to offer EBM via spoon 5-7 ml  3. I and O sheet reviewed.  All questions answered at the end of the visit.   Maternal Data Has patient been taught Hand Expression?: Yes Does the patient have breastfeeding experience prior to this delivery?: Yes How long did the patient breastfeed?: 1 year  Feeding Mother's Current Feeding Choice: Breast Milk  LATCH Score Latch: Repeated attempts needed to sustain latch, nipple held in mouth throughout feeding, stimulation needed to elicit sucking reflex.  Audible Swallowing: Spontaneous and intermittent  Type of Nipple: Everted at rest and after stimulation  Comfort (Breast/Nipple): Filling, red/small blisters or bruises, mild/mod discomfort  Hold (Positioning): Assistance needed to correctly position infant at breast and maintain latch.  LATCH Score: 7   Lactation Tools Discussed/Used    Interventions Interventions: Breast feeding basics reviewed;Adjust position;Assisted with latch;Support pillows;Skin to skin;Education;Breast massage;Expressed milk;Hand express;LC Psychologist, educational;Infant Driven Feeding Algorithm education;Breast compression  Discharge Pump: Personal  Consult Status Consult Status: Follow-up Date: 10/20/21 Follow-up type:  In-patient    Dontrey Snellgrove  Nicholson-Springer 10/19/2021, 7:18 PM

## 2021-10-19 NOTE — H&P (Addendum)
Autumn Stephens is a 30 y.o. female presenting for IOL for CHTN vs GHTN at 39 wks. No s/s PEC. Nl labs. OB History     Gravida  4   Para  3   Term  3   Preterm  0   AB  0   Living  3      SAB  0   IAB  0   Ectopic  0   Multiple  0   Live Births  3          Past Medical History:  Diagnosis Date   Diffuse cystic mastopathy    Dysrhythmia    tachycardia on atenol last pregnancy   Epstein-Barr infection    H/O varicella    Irritable bowel syndrome    Maternal anemia complicating pregnancy, childbirth, or the puerperium 09/28/2012   Migraine, unspecified, without mention of intractable migraine without mention of status migrainosus    NSVD (normal spontaneous vaginal delivery - 11/5) 09/28/2012   Other malaise and fatigue    Perineal laceration, first degree 09/28/2012   Postpartum care following vaginal delivery 09/28/2012   Pregnancy induced hypertension    Pyelonephritis    Rubella nonimmune status, delivered, current hospitalization 09/28/2012   Tachycardia    Past Surgical History:  Procedure Laterality Date   CHOLECYSTECTOMY N/A 04/16/2019   Procedure: LAPAROSCOPIC CHOLECYSTECTOMY WITH INTRAOPERATIVE CHOLANGIOGRAM;  Surgeon: Griselda Miner, MD;  Location: [facility];  Service: General;  Laterality: N/A;   Family History: family history includes Cancer in her maternal grandfather and mother; Diabetes in her maternal grandfather; Hyperlipidemia in her mother; Rheum arthritis in her father. Social History:  reports that she has never smoked. She has never used smokeless tobacco. She reports that she does not drink alcohol and does not use drugs.     Maternal Diabetes: No Genetic Screening: Normal Maternal Ultrasounds/Referrals: Normal Fetal Ultrasounds or other Referrals:  None Maternal Substance Abuse:  Yes:  Type: Other: none Significant Maternal Medications:  Meds include: Other: metroprolol Significant Maternal Lab Results:  Group B Strep negative Other  Comments:  None  Review of Systems History Dilation: 1 Effacement (%): 50 Station: -3 Exam by:: Henderson Newcomer Blood pressure 126/78, pulse 95, temperature 98.1 F (36.7 C), temperature source Oral, resp. rate 18, height 5\' 9"  (1.753 m), weight 119.7 kg, last menstrual period 12/06/2020. Maternal Exam:  Introitus: Normal vulva.  Physical Exam Constitutional:      Appearance: Normal appearance.  HENT:     Head: Normocephalic and atraumatic.  Cardiovascular:     Rate and Rhythm: Normal rate and regular rhythm.     Pulses: Normal pulses.     Heart sounds: Normal heart sounds.  Pulmonary:     Effort: Pulmonary effort is normal.     Breath sounds: Normal breath sounds.  Genitourinary:    General: Normal vulva.  Musculoskeletal:        General: Normal range of motion.     Cervical back: Normal range of motion and neck supple.  Skin:    General: Skin is warm and dry.  Neurological:     General: No focal deficit present.     Mental Status: She is alert and oriented to person, place, and time.    Prenatal labs: ABO, Rh: --/--/A POS (11/27 07-23-1973) Antibody: NEG (11/27 0736) Rubella:  imm RPR:   neg HBsAg:   neg HIV:   neg GBS:   neg  Assessment/Plan: 39 wks CHTN with nl labs Benign tachycardia ( nl  echo) on Bblocker IOL   Righteous Claiborne J 10/19/2021, 9:37 AM

## 2021-10-20 ENCOUNTER — Encounter (HOSPITAL_COMMUNITY): Payer: Self-pay | Admitting: Obstetrics and Gynecology

## 2021-10-20 DIAGNOSIS — O139 Gestational [pregnancy-induced] hypertension without significant proteinuria, unspecified trimester: Secondary | ICD-10-CM

## 2021-10-20 LAB — CBC
HCT: 29.7 % — ABNORMAL LOW (ref 36.0–46.0)
Hemoglobin: 10.1 g/dL — ABNORMAL LOW (ref 12.0–15.0)
MCH: 32.4 pg (ref 26.0–34.0)
MCHC: 34 g/dL (ref 30.0–36.0)
MCV: 95.2 fL (ref 80.0–100.0)
Platelets: 151 10*3/uL (ref 150–400)
RBC: 3.12 MIL/uL — ABNORMAL LOW (ref 3.87–5.11)
RDW: 14.6 % (ref 11.5–15.5)
WBC: 7.9 10*3/uL (ref 4.0–10.5)
nRBC: 0 % (ref 0.0–0.2)

## 2021-10-20 MED ORDER — NIFEDIPINE ER OSMOTIC RELEASE 30 MG PO TB24
30.0000 mg | ORAL_TABLET | Freq: Every day | ORAL | Status: DC
  Administered 2021-10-20: 09:00:00 30 mg via ORAL
  Filled 2021-10-20: qty 1

## 2021-10-20 MED ORDER — FERROUS SULFATE 325 (65 FE) MG PO TABS
325.0000 mg | ORAL_TABLET | Freq: Every day | ORAL | 3 refills | Status: AC
Start: 2021-10-20 — End: 2022-10-20

## 2021-10-20 MED ORDER — IBUPROFEN 600 MG PO TABS: 600.0000 mg | ORAL_TABLET | Freq: Four times a day (QID) | ORAL | 0 refills | Status: AC

## 2021-10-20 MED ORDER — COCONUT OIL OIL: 1.0000 "application " | TOPICAL_OIL | 0 refills | Status: AC | PRN

## 2021-10-20 MED ORDER — PRENATAL MULTIVITAMIN CH: 1.0000 | ORAL_TABLET | Freq: Every day | ORAL | Status: AC

## 2021-10-20 MED ORDER — ACETAMINOPHEN 325 MG PO TABS: 650.0000 mg | ORAL_TABLET | ORAL | Status: AC | PRN

## 2021-10-20 MED ORDER — NIFEDIPINE ER 30 MG PO TB24: 30.0000 mg | ORAL_TABLET | Freq: Every day | ORAL | 0 refills | Status: AC

## 2021-10-20 MED ORDER — CEPHALEXIN 500 MG PO CAPS
500.0000 mg | ORAL_CAPSULE | Freq: Three times a day (TID) | ORAL | 0 refills | Status: AC
Start: 2021-10-20 — End: 2021-10-27

## 2021-10-20 NOTE — Discharge Summary (Signed)
Postpartum Discharge Summary  Date of Service updated 10/20/21     Patient Name: Autumn Stephens DOB: 10-11-1991 MRN: [id number]  Date of admission: 10/19/2021 Delivery date:10/19/2021  Delivering provider: Brien Few  Date of discharge: 10/20/2021  Admitting diagnosis: Encounter for induction of labor [Z34.90] Intrauterine pregnancy: [redacted]w[redacted]d    Secondary diagnosis:  Principal Problem:   Postpartum care following vaginal delivery 11/27 Active Problems:   Encounter for induction of labor   First degree perineal laceration   SVD (spontaneous vaginal delivery)   Gestational hypertension  Additional problems: Benign tachycardia - stable on Metoprolol, hx of PP UTI with pyelonephritis on prophylactic Keflex x 7 days    Discharge diagnosis: Term Pregnancy Delivered and Anemia                                              Post partum procedures: started on Procardia 327mXL daily  Augmentation: AROM, Pitocin, and Cytotec Complications: None  Hospital course: Induction of Labor With Vaginal Delivery   [age].o. yo [id number] 3939w3ds admitted to the hospital 10/19/2021 for induction of labor.  Indication for induction: Gestational vs. CHTN.  Patient had an uncomplicated labor course as follows: Membrane Rupture Time/Date: 12:34 PM ,10/19/2021   Delivery Method:Vaginal, Spontaneous  Episiotomy: None  Lacerations:  1st degree  Details of delivery can be found in separate delivery note.  Patient's postpartum course was complicated by persistent mild range BPs. She was started on Procardia 10m25m daily. Patient expressed strong desire to leave at 24 hrs PP.  Consulted Dr. TaavRonita Hipps BP management and early d/c - okay to d/c home on Procardia with close f/u on Friday or Monday for appt. Pt. Has a reliable BP cuff at home. Advised patient to check BPs twice a day and parameters given to call. Pt. Counseled on risks of PP PEC and warning s/s reviewed. Reviewed risk of readmission if  develops PEC or unable to control BPs outpatient. Pt. Agrees. Patient is discharged home 10/20/21.  Newborn Data: Birth date:10/19/2021  Birth time:3:30 PM  Gender:Female  Living status:Living  Apgars:8 ,9  Weight:4050 g  "[name]"   Magnesium Sulfate received: No BMZ received: No Rhophylac:N/A MMR:N/A T-DaP: not on file  Flu: N/A Transfusion:No  Physical exam  Vitals:   10/20/21 0233 10/20/21 0548 10/20/21 0856 10/20/21 1139  BP: (!) 140/93 (!) 137/91 (!) 136/96 (!) 136/92  Pulse: 87 93  71  Resp: 19 18    Temp: 98.2 F (36.8 C) 97.8 F (36.6 C)    TempSrc: Oral Oral    SpO2: 98% 97%    Weight:      Height:       Alert and oriented X3  Lungs: Clear and unlabored  Heart: regular rate and rhythm / no murmurs  Abdomen: soft, non-tender, non-distended              Fundus: firm, non-tender, U-2  Perineum: well approximated 1st degree perineal repair  Lochia: appropriate  Extremities: trace LE and upper extremity edema, no calf pain or tenderness, +2 DTRs, no clonus bilaterally   Labs: Lab Results  Component Value Date   WBC 7.9 10/20/2021   HGB 10.1 (L) 10/20/2021   HCT 29.7 (L) 10/20/2021   MCV 95.2 10/20/2021   PLT 151 10/20/2021   CMP Latest Ref Rng & Units 10/19/2021  Glucose 70 - 99 mg/dL 93  BUN 6 - 20 mg/dL 6  Creatinine 0.44 - 1.00 mg/dL 0.72  Sodium 135 - 145 mmol/L 136  Potassium 3.5 - 5.1 mmol/L 4.2  Chloride 98 - 111 mmol/L 107  CO2 22 - 32 mmol/L 20(L)  Calcium 8.9 - 10.3 mg/dL 8.7(L)  Total Protein 6.5 - 8.1 g/dL 5.9(L)  Total Bilirubin 0.3 - 1.2 mg/dL 0.7  Alkaline Phos 38 - 126 U/L 77  AST 15 - 41 U/L 18  ALT 0 - 44 U/L 8   Edinburgh Score: Edinburgh Postnatal Depression Scale Screening Tool 10/20/2021  I have been able to laugh and see the funny side of things. (No Data)      After visit meds:  Allergies as of 10/20/2021       Reactions   Augmentin [amoxicillin-pot Clavulanate] Nausea And Vomiting   Did it involve swelling of  the face/tongue/throat, SOB, or low BP? No Did it involve sudden or severe rash/hives, skin peeling, or any reaction on the inside of your mouth or nose? No Did you need to seek medical attention at a hospital or doctor's office? No When did it last happen?  "several months ago"     If all above answers are "NO", may proceed with cephalosporin use.   Biaxin [clarithromycin] Nausea And Vomiting   Moxifloxacin Nausea And Vomiting        Medication List     STOP taking these medications    benzonatate 100 MG capsule Commonly known as: TESSALON   metoprolol succinate 25 MG 24 hr tablet Commonly known as: TOPROL-XL       TAKE these medications    acetaminophen 325 MG tablet Commonly known as: Tylenol Take 2 tablets (650 mg total) by mouth every 4 (four) hours as needed (for pain scale < 4).   cephALEXin 500 MG capsule Commonly known as: KEFLEX Take 1 capsule (500 mg total) by mouth every 8 (eight) hours for 7 days.   coconut oil Oil Apply 1 application topically as needed.   ferrous sulfate 325 (65 FE) MG tablet Take 1 tablet (325 mg total) by mouth daily.   ibuprofen 600 MG tablet Commonly known as: ADVIL Take 1 tablet (600 mg total) by mouth every 6 (six) hours.   NIFEdipine 30 MG 24 hr tablet Commonly known as: ADALAT CC Take 1 tablet (30 mg total) by mouth daily. Start taking on: October 21, 2021   prenatal multivitamin Tabs tablet Take 1 tablet by mouth daily at 12 noon. Start taking on: October 21, 2021         Discharge home in stable condition Infant Feeding: Breast Infant Disposition:home with mother Discharge instruction: per After Visit Summary and Postpartum booklet. Activity: Advance as tolerated. Pelvic rest for 6 weeks.  Diet: low salt diet Anticipated Birth Control:  not discussed  Postpartum Appointment:6 weeks Additional Postpartum F/U: BP check 1 week Future Appointments:No future appointments. Follow up Visit:  Follow-up  Information     Brien Few, MD Follow up in 1 week(s).   Specialty: Obstetrics and Gynecology Why: Needs appt Friday or Monday for PP BP check Contact information: [address] [place] [address] [phone]                     10/20/2021 Darliss Cheney, CNM

## 2021-10-20 NOTE — Anesthesia Postprocedure Evaluation (Signed)
Anesthesia Post Note  Patient: Autumn Stephens  Procedure(s) Performed: AN AD HOC LABOR EPIDURAL     Patient location during evaluation: Mother Baby Anesthesia Type: Epidural Level of consciousness: awake Pain management: satisfactory to patient Vital Signs Assessment: post-procedure vital signs reviewed and stable Respiratory status: spontaneous breathing Cardiovascular status: stable Anesthetic complications: no   No notable events documented.  Last Vitals:  Vitals:   10/20/21 0233 10/20/21 0548  BP: (!) 140/93 (!) 137/91  Pulse: 87 93  Resp: 19 18  Temp: 36.8 C 36.6 C  SpO2: 98% 97%    Last Pain:  Vitals:   10/20/21 0629  TempSrc:   PainSc: 4    Pain Goal:                   Cephus Shelling

## 2021-10-20 NOTE — Progress Notes (Signed)
PPD #1, SVD, 1st degree perineal, baby girl "[place]"   S:  Reports feeling okay. C/o right sided lower back pain - reports hx of PP UTI and pyelo x 2 and states pain feels similar, but improves with Motrin and Tylenol. Denies HA, visual changes, RUQ/epigastric pain. Pt. Expresses strong desire for discharge at 24 hrs.              Tolerating po/ No nausea or vomiting / Denies dizziness or SOB             Bleeding is light             Pain controlled with Motrin and Tylenol             Up ad lib / ambulatory / voiding QS without difficulty   Newborn breast feeding - going okay, has experience with 3 other children, may use nipple shield or pump if needed  O:               VS: BP (!) 136/96 (BP Location: Right Arm)   Pulse 93   Temp 97.8 F (36.6 C) (Oral)   Resp 18   Ht 5\' 9"  (1.753 m)   Wt 119.7 kg   LMP 12/06/2020   SpO2 97%   Breastfeeding Unknown   BMI 38.99 kg/m  Patient Vitals for the past 24 hrs:  BP Temp Temp src Pulse Resp SpO2  10/20/21 0856 (!) 136/96 -- -- -- -- --  10/20/21 0548 (!) 137/91 97.8 F (36.6 C) Oral 93 18 97 %  10/20/21 0233 (!) 140/93 98.2 F (36.8 C) Oral 87 19 98 %  10/19/21 2320 127/87 -- -- -- -- --  10/19/21 2247 132/84 98.4 F (36.9 C) Oral 83 18 98 %  10/19/21 1850 (!) 148/94 97.9 F (36.6 C) -- (!) 103 18 100 %  10/19/21 1745 (!) 144/91 98.6 F (37 C) -- 86 18 98 %  10/19/21 1615 133/89 -- -- 98 -- --  10/19/21 1601 (!) 134/94 -- -- 90 20 --  10/19/21 1545 (!) 124/91 -- -- 79 20 99 %  10/19/21 1535 (!) 119/91 -- -- 88 18 100 %  10/19/21 1501 138/90 -- -- 88 20 --  10/19/21 1431 (!) 141/88 98.1 F (36.7 C) Oral 94 18 --  10/19/21 1406 139/89 -- -- 95 18 --  10/19/21 1401 138/86 -- -- 89 20 --  10/19/21 1356 (!) 143/83 -- -- 81 18 --  10/19/21 1351 138/87 -- -- 86 18 --  10/19/21 1346 135/87 -- -- 90 18 100 %  10/19/21 1341 137/78 -- -- 86 20 --  10/19/21 1340 (!) 141/76 -- -- 92 20 100 %  10/19/21 1336 (!) 148/89 -- -- (!) 109 18 --   10/19/21 1314 (!) 143/88 -- -- 91 18 --  10/19/21 1235 -- 98 F (36.7 C) Oral -- -- --  10/19/21 1231 (!) 148/87 -- -- 88 20 --  10/19/21 1144 (!) 151/91 -- -- 85 18 --      LABS:              Recent Labs    10/19/21 1621 10/20/21 0443  WBC 9.6 7.9  HGB 10.9* 10.1*  PLT 168 151               Blood type: --/--/A POS (11/27 0736)  Rubella:        Immune  I&O: Intake/Output      11/27 0701 11/28 0700 11/28 0701 11/29 0700   Urine (mL/kg/hr) 400    Blood 100    Total Output 500    Net -500         Urine Occurrence 1 x     Flu:not on file Tdap: not on file Covid:not on file             Physical Exam:             Alert and oriented X3  Lungs: Clear and unlabored  Heart: regular rate and rhythm / no murmurs  Abdomen: soft, non-tender, non-distended              Fundus: firm, non-tender, U-2  Perineum: well approximated 1st degree perineal repair  Lochia: appropriate  Extremities: trace LE and upper extremity edema, no calf pain or tenderness, +2 DTRs, no clonus bilaterally     A/P: PPD # 1, SVD 1st degree perineal  Benign Tachycardia - Stable off Metoprolol  Gestational vs. CHTN - Persistent mild range BPs  - Started on Procardia 30mg  XL daily  ABL Anemia compounding chronic IDA  - Begin Ferrous sulfate daily Hx. Of PP UTI x 2 with pyelonephritis  - on prophylactic Keflex 500mg  q8hrs x 7 days   Doing well - stable status Routine post partum orders  Consulted Dr. for BP management and early d/c - okay to d/c on Procardia with close f/u on Friday or Monday for appt. Pt. Has a reliable BP cuff at home. Advised patient to check BPs twice a day and parameters given to call. Pt. Counseled on risks of PP PEC and warning s/s reviewed. Reviewed risk of readmission if develops PEC or unable to control BPs outpatient. Pt. Agrees. Discharge instructions reviewed, [facility] discharge book given.  Tuesday, MSN, CNM [facility]

## 2021-10-20 NOTE — Lactation Note (Addendum)
This note was copied from a baby's chart. Lactation Consultation Note  Patient Name: Autumn Stephens [id number] Date: 10/20/2021 Reason for consult: Follow-up assessment;Nipple pain/trauma;Term Age:31 hours  LC in to visit with P4 Mom of term baby.  Baby at 3.1% weight loss with 2 voids and 4 stools so far.   Baby on the breast in laid back football hold on left breast.  LC placed support under baby's head and Mom's hand.  Mom c/o of discomfort during feedings.  Baby taken off breast and nipple pinched.  Baby also noted to be clicking during sucking.  Mom to use EBM to nipple after feeding for soreness.  Oral assessment done.  Baby with a labial frenulum to gum line, lip able to flange on breast.  Baby with a slightly high palate, and possible posterior short frenulum on digital swipe.  On suck assessment baby elevating back of tongue and pushing against finger.    Baby went back on breast with a deep latch, LC pulled on chin and tried to open baby's mouth wider.  Clicking and swallows identified.  Mom sized for a 20 mm nipple shield as she stated that the NS was a life saver with her 2nd baby until she got her "tongue clipped".  Baby too sleepy at this time to latch with nipple shield.  Showed Mom how to properly invert and stretch over nipple to pull nipple into shield.   Mom declined pumping.  Talked about the benefits of pumping with possible tongue restriction and use of nipple shield.  Mom reports having an over supply with her other babies from too much pumping.  Mom also wants to be discharged early today.  Talked to Mom about importance of pumping after breastfeeding every other feeding and offer baby any EBM by spoon.  Engorgement prevention and treatment reviewed.  Mom desires OP lactation f/u, message sent to clinic. Mom aware of OP lactation support.     LATCH Score Latch: Grasps breast easily, tongue down, lips flanged, rhythmical sucking.  Audible Swallowing: Spontaneous  and intermittent (clicking noted during sucking episodes)  Type of Nipple: Everted at rest and after stimulation  Comfort (Breast/Nipple): Filling, red/small blisters or bruises, mild/mod discomfort  Hold (Positioning): Assistance needed to correctly position infant at breast and maintain latch.  LATCH Score: 8   Lactation Tools Discussed/Used Tools: Nipple Shields Nipple shield size: 20  Interventions Interventions: Breast feeding basics reviewed;Assisted with latch;Skin to skin;Breast massage;Hand express;Breast compression;Adjust position;Support pillows;Position options  Discharge Discharge Education: Engorgement and breast care;Outpatient recommendation;Outpatient Epic message sent Pump: Personal (Mom has several pumps at home, including a Medela PIS)  Consult Status Consult Status: Follow-up Date: 10/20/21 Follow-up type: In-patient    Judee Clara 10/20/2021, 11:20 AM

## 2021-11-01 ENCOUNTER — Telehealth (HOSPITAL_COMMUNITY): Payer: Self-pay

## 2021-11-01 NOTE — Telephone Encounter (Signed)
"  I'm doing fine." Patient has no questions or concerns about her healing.  "She's doing good. She is eating and gaining weight. She sleeps in her bassinet." RN reviewed ABC's of safe sleep with patient. Patient declines any questions or concerns about baby. . EPDS score is 0.  Marcelino Duster [facility] 11/01/2021,1209
